# Patient Record
Sex: Female | Born: 1935 | Race: White | Hispanic: No | Marital: Married | Smoking: Never smoker
Health system: Southern US, Community
[De-identification: ages and names within clinical notes are randomized; demographics above are authoritative.]

## PROBLEM LIST (undated history)

## (undated) DIAGNOSIS — M1990 Unspecified osteoarthritis, unspecified site: Secondary | ICD-10-CM

## (undated) DIAGNOSIS — R002 Palpitations: Secondary | ICD-10-CM

## (undated) HISTORY — DX: Unspecified osteoarthritis, unspecified site: M19.90

## (undated) HISTORY — DX: Palpitations: R00.2

---

## 1999-04-14 ENCOUNTER — Other Ambulatory Visit: Admission: RE | Admit: 1999-04-14 | Discharge: 1999-04-14 | Payer: Self-pay | Admitting: Cardiology

## 1999-12-24 ENCOUNTER — Other Ambulatory Visit: Admission: RE | Admit: 1999-12-24 | Discharge: 1999-12-24 | Payer: Self-pay | Admitting: Cardiology

## 2000-04-22 ENCOUNTER — Encounter (INDEPENDENT_AMBULATORY_CARE_PROVIDER_SITE_OTHER): Payer: Self-pay | Admitting: Specialist

## 2000-04-22 ENCOUNTER — Other Ambulatory Visit: Admission: RE | Admit: 2000-04-22 | Discharge: 2000-04-22 | Payer: Self-pay | Admitting: Obstetrics and Gynecology

## 2001-08-08 ENCOUNTER — Other Ambulatory Visit: Admission: RE | Admit: 2001-08-08 | Discharge: 2001-08-08 | Payer: Self-pay | Admitting: Obstetrics and Gynecology

## 2001-12-05 ENCOUNTER — Encounter: Payer: Self-pay | Admitting: Family Medicine

## 2001-12-05 ENCOUNTER — Encounter (INDEPENDENT_AMBULATORY_CARE_PROVIDER_SITE_OTHER): Payer: Self-pay | Admitting: *Deleted

## 2001-12-05 ENCOUNTER — Ambulatory Visit (HOSPITAL_COMMUNITY): Admission: RE | Admit: 2001-12-05 | Discharge: 2001-12-05 | Payer: Self-pay | Admitting: Family Medicine

## 2002-09-12 ENCOUNTER — Other Ambulatory Visit: Admission: RE | Admit: 2002-09-12 | Discharge: 2002-09-12 | Payer: Self-pay | Admitting: Obstetrics and Gynecology

## 2003-08-08 ENCOUNTER — Ambulatory Visit (HOSPITAL_COMMUNITY): Admission: RE | Admit: 2003-08-08 | Discharge: 2003-08-08 | Payer: Self-pay | Admitting: Family Medicine

## 2003-10-17 ENCOUNTER — Other Ambulatory Visit: Admission: RE | Admit: 2003-10-17 | Discharge: 2003-10-17 | Payer: Self-pay | Admitting: Obstetrics and Gynecology

## 2003-12-07 ENCOUNTER — Ambulatory Visit (HOSPITAL_COMMUNITY): Admission: RE | Admit: 2003-12-07 | Discharge: 2003-12-07 | Payer: Self-pay | Admitting: Orthopaedic Surgery

## 2004-10-16 ENCOUNTER — Other Ambulatory Visit: Admission: RE | Admit: 2004-10-16 | Discharge: 2004-10-16 | Payer: Self-pay | Admitting: Obstetrics and Gynecology

## 2004-12-17 ENCOUNTER — Encounter: Payer: Self-pay | Admitting: Internal Medicine

## 2004-12-23 ENCOUNTER — Encounter (INDEPENDENT_AMBULATORY_CARE_PROVIDER_SITE_OTHER): Payer: Self-pay | Admitting: *Deleted

## 2004-12-23 ENCOUNTER — Ambulatory Visit (HOSPITAL_COMMUNITY): Admission: RE | Admit: 2004-12-23 | Discharge: 2004-12-23 | Payer: Self-pay | Admitting: Gastroenterology

## 2004-12-24 ENCOUNTER — Encounter: Admission: RE | Admit: 2004-12-24 | Discharge: 2004-12-24 | Payer: Self-pay | Admitting: Neurology

## 2005-08-03 HISTORY — PX: OTHER SURGICAL HISTORY: SHX169

## 2006-01-14 ENCOUNTER — Encounter: Admission: RE | Admit: 2006-01-14 | Discharge: 2006-01-14 | Payer: Self-pay | Admitting: Orthopaedic Surgery

## 2006-03-23 ENCOUNTER — Encounter: Admission: RE | Admit: 2006-03-23 | Discharge: 2006-03-23 | Payer: Self-pay | Admitting: Sports Medicine

## 2008-04-25 ENCOUNTER — Encounter: Admission: RE | Admit: 2008-04-25 | Discharge: 2008-04-25 | Payer: Self-pay | Admitting: Internal Medicine

## 2008-12-11 ENCOUNTER — Encounter: Payer: Self-pay | Admitting: Internal Medicine

## 2009-01-14 ENCOUNTER — Ambulatory Visit: Payer: Self-pay | Admitting: Internal Medicine

## 2009-01-14 DIAGNOSIS — R05 Cough: Secondary | ICD-10-CM

## 2009-01-14 DIAGNOSIS — G2581 Restless legs syndrome: Secondary | ICD-10-CM | POA: Insufficient documentation

## 2009-01-14 DIAGNOSIS — R059 Cough, unspecified: Secondary | ICD-10-CM | POA: Insufficient documentation

## 2009-01-28 ENCOUNTER — Ambulatory Visit: Payer: Self-pay | Admitting: Internal Medicine

## 2010-01-29 ENCOUNTER — Encounter (INDEPENDENT_AMBULATORY_CARE_PROVIDER_SITE_OTHER): Payer: Self-pay | Admitting: *Deleted

## 2010-04-14 ENCOUNTER — Encounter (INDEPENDENT_AMBULATORY_CARE_PROVIDER_SITE_OTHER): Payer: Self-pay | Admitting: *Deleted

## 2010-04-16 DIAGNOSIS — K5730 Diverticulosis of large intestine without perforation or abscess without bleeding: Secondary | ICD-10-CM | POA: Insufficient documentation

## 2010-04-16 DIAGNOSIS — K219 Gastro-esophageal reflux disease without esophagitis: Secondary | ICD-10-CM | POA: Insufficient documentation

## 2010-04-21 ENCOUNTER — Ambulatory Visit: Payer: Self-pay | Admitting: Internal Medicine

## 2010-04-22 ENCOUNTER — Ambulatory Visit: Payer: Self-pay | Admitting: Internal Medicine

## 2010-04-24 ENCOUNTER — Encounter: Payer: Self-pay | Admitting: Internal Medicine

## 2010-05-02 ENCOUNTER — Telehealth: Payer: Self-pay | Admitting: Internal Medicine

## 2010-05-29 ENCOUNTER — Encounter: Admission: RE | Admit: 2010-05-29 | Discharge: 2010-05-29 | Payer: Self-pay | Admitting: Family Medicine

## 2010-06-06 ENCOUNTER — Encounter: Admission: RE | Admit: 2010-06-06 | Discharge: 2010-06-06 | Payer: Self-pay | Admitting: Family Medicine

## 2010-08-24 ENCOUNTER — Encounter: Payer: Self-pay | Admitting: Sports Medicine

## 2010-09-02 NOTE — Letter (Signed)
Summary: [facility]  [facility]   Imported By: Sherian Rein 05/14/2010 08:35:07  _____________________________________________________________________  External Attachment:    Type:   Image     Comment:   External Document

## 2010-09-02 NOTE — Progress Notes (Signed)
Summary: Cx Manometry/PH Probe  Phone Note Other Incoming   Caller: [facility] Summary of Call: Patient called to cancel appointment for 24 hour PH probe as well as manometry on Monday since she feels better now. Hospital called to let us know this. Initial call taken by: Lamona Curl CMA Duncan Dull),  May 02, 2010 5:19 PM

## 2010-09-02 NOTE — Letter (Signed)
Summary: Patient [facility] Biopsy Results  [facility]  [address] [place], [place] [address]   Phone: [phone]  Fax: [phone]        April 24, 2010 MRN: [id number]    [facility] [address] [place], [place]  [address]    Dear Ms. Weigelt,  I am pleased to inform you that the biopsies taken during your recent endoscopic examination did not show any evidence of cancer upon pathologic examination.The biopsies from Your esophagus showed mild inflammation due to reflux  Additional information/recommendations:  __No further action is needed at this time.  Please follow-up with      your primary care physician for your other healthcare needs.  __ Please call [phone] to schedule a return visit to review      your condition.  _x_ Continue with the treatment plan as outlined on the day of your      exam.     Please call us if you are having persistent problems or have questions about your condition that have not been fully answered at this time.  Sincerely,  Hart Carwin MD  This letter has been electronically signed by your physician.  Appended Document: Patient Notice-Endo Biopsy Results letter mailed

## 2010-09-02 NOTE — Assessment & Plan Note (Signed)
Summary: GERD/CHRONIC PPI/YF   History of Present Illness Visit Type: consult  Primary GI MD: Lina Sar MD Primary Provider: Larina Earthly, MD  Requesting Provider: Larina Earthly, MD  Chief Complaint: GERD and bloating  History of Present Illness:   This is a 77 year old white female with a several year history of cough which occurs during the day but mostly at night whenever she lays down. The cough is dry and she has coughing spells lasting several minutes at a time. It occurs within 5 minutes of laying down and keeps her up at night.It is relieved by sitting up. There has been an occasional problem swallowing liquids as well as solids. A trial of acid reducing agent has not helped significantly. She has seen Dr.Wert recently who says that her cough is multifactorial; one of factors  being gastroesophageal reflux. She denies having any clinically overt reflux in the past. An upper abdominal ultrasound in May 2003 was negative. An upper GI series in May 2003 showed numerous tertiary contractions of her esophagus consistent with esophageal dysmotility. A colonoscopy in May 2006 showed diverticulosis. She has tried cough syrup to suppress the cough which has been the only medication that has helped. She has never been tried on steroids and she has never seen an ear, nose and throat specialist. There has been intermittent hoarseness and raspiness of her voice. Patient has gained about 35 pounds in the last several years.   GI Review of Systems    Reports acid reflux, bloating, and  heartburn.      Denies abdominal pain, belching, chest pain, dysphagia with liquids, dysphagia with solids, loss of appetite, nausea, vomiting, vomiting blood, weight loss, and  weight gain.        Denies anal fissure, black tarry stools, change in bowel habit, constipation, diarrhea, diverticulosis, fecal incontinence, heme positive stool, hemorrhoids, irritable bowel syndrome, jaundice, light color stool, liver problems,  rectal bleeding, and  rectal pain.    Current Medications (verified): 1)  Calcium Carbonate-Vitamin D 600-400 Mg-Unit  Tabs (Calcium Carbonate-Vitamin D) .... Take 1 Tablet By Mouth Two Times A Day 2)  Glucosamine-Chondroitin   Caps (Glucosamine-Chondroit-Vit C-Mn) .... Take 1 Capsule By Mouth Once A Day 3)  Ativan 2 Mg Tabs (Lorazepam) .Marland Kitchen.. 1 At Bedtime 4)  Requip 4 Mg Tabs (Ropinirole Hcl) .... Take 1 Tablet By Mouth Two Times A Day 5)  Prilosec 20 Mg Cpdr (Omeprazole) .... Take 1 Capsule By Mouth Once A Day 30 Minutes Before Meal 6)  Chlor-Trimeton 4 Mg Tabs (Chlorpheniramine Maleate) .... Take 1 Tab By Mouth At Bedtime 7)  Delsym 30 Mg/19ml Lqcr (Dextromethorphan Polistirex) .... 2 Teaspoons Every 12 Hours As Needed  Allergies (verified): 1)  ! Sulfa  Past History:  Past Medical History: RESTLESS LEG SYNDROME (ICD-333.94) CHRONIC COUGH     - onset 1994 DIVERTICULOSIS, COLON (ICD-562.10) GERD (ICD-530.81) COUGH (ICD-786.2) RESTLESS LEG SYNDROME (ICD-333.94)   Arthritis  Past Surgical History: Reviewed history from 04/16/2010 and no changes required. 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  Alcohol Use - no Daily Caffeine Use: 2 daily  Illicit Drug Use - no Smoking Status:  never Drug Use:  no  Review of Systems       The patient complains of anxiety-new, arthritis/joint pain, back pain, cough, fatigue, shortness of breath, and sleeping problems.  The patient denies allergy/sinus, anemia, blood in urine, breast changes/lumps, change in vision,  confusion, coughing up blood, depression-new, fainting, fever, headaches-new, hearing problems, heart murmur, heart rhythm changes, itching, menstrual pain, muscle pains/cramps, night sweats, nosebleeds, pregnancy symptoms, skin rash, sore throat, swelling of feet/legs, swollen lymph glands, thirst - excessive, urination -  excessive, urination changes/pain, urine leakage, vision changes, and voice change.         Pertinent positive and negative review of systems were noted in the above HPI. All other ROS was otherwise negative.   Vital Signs:  Patient profile:   77 year old female Height:      64 inches Weight:      166 pounds BMI:     28.60 BSA:     1.81 Pulse rate:   92 / minute Pulse rhythm:   regular BP sitting:   126 / 80  (left arm) Cuff size:   regular  Vitals Entered By: Ok Anis CMA (April 21, 2010 10:47 AM)  Physical Exam  General:  alert, oriented, slightly raspy voice. Eyes:  PERRLA, no icterus. Mouth:  No deformity or lesions, dentition normal. Neck:  Supple; no masses or thyromegaly. Lungs:  Clear throughout to auscultation. Heart:  Regular rate and rhythm; no murmurs, rubs,  or bruits. Abdomen:  soft nontender abdomen with normoactive bowel sounds. No distention. Liver edge at costal margin. Extremities:  No clubbing, cyanosis, edema or deformities noted. Skin:  Intact without significant lesions or rashes. Psych:  Alert and cooperative. Normal mood and affect.   Impression & Recommendations:  Problem # 1:  COUGH (ICD-786.2) Patient has chronic nocturnal cough which is positional , precipitated by laying down. and relieved by sitting up.  She gets no relief from acid suppressing agents. We will proceed with a 24 hour intraesophageal pH probe off acid reducers and also with esophageal manometry to assess her  motility which was abnormal 7 years ago. He will also schedule her for an upper endoscopy and possible dilatation with appropriate biopsies from the GE junction.  Orders: Mano/24 hour pH Probe (Mano/24 pH)  Problem # 2:  DIVERTICULOSIS, COLON (ICD-562.10) Patient is status post a normal colonoscopy in 2006. A recall colonscopy will be due 10 years from that date.  Problem # 3:  GERD (ICD-530.81) There is no history of gastroesophageal reflux as told by the patient.  We need to rule out silent reflux or LPR. I would consider a referral to [facility] to see Dr.Rees or Dr Delford Field. (phone [phone]) based on the results of GI evaluation.  Orders: Mano/24 hour pH Probe (Mano/24 pH) EGD (EGD)  Patient Instructions: 1)  upper endoscopy with biopsies and possible dilation. 2)  24 hour intraesophageal pH probe off Prilosec. 3)  Esophageal manometry. 4)  Based on the results of the above workup, consider referral to [facility] (Dr Madilyn Hook). 5)  Copy sent to : Dr Felipa Eth 6)  The medication list was reviewed and reconciled.  All changed / newly prescribed medications were explained.  A complete medication list was provided to the patient / caregiver.  Appended Document: GERD/CHRONIC PPI/YF Other Incoming   Caller: [facility] Summary of Call: Patient called to cancel appointment for 24 hour PH probe as well as manometry on Monday since she feels better now. Hospital called to let us know this. Initial call taken by: Lamona Curl CMA Duncan Dull),  May 02, 2010 5:19 PM       Signed by Lamona Curl CMA (AAMA) on 05/02/2010 at 5:20 PM

## 2010-09-02 NOTE — Procedures (Signed)
Summary: Upper Endoscopy  Patient: Alexandra Higgins Note: All result statuses are Final unless otherwise noted.  Tests: (1) Upper Endoscopy (EGD)   EGD Upper Endoscopy       DONE     [facility]     [address]     [place], [place]  [address]           ENDOSCOPY PROCEDURE REPORT           PATIENT:  [name], [name]  MR#:  [id number]     BIRTHDATE:  08/30/1928, 81 yrs. old  GENDER:  female           ENDOSCOPIST:  Hedwig Morton. Juanda Chance, MD     Referred by:  Chilton Greathouse, M.D.           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  The [facility] GIF-H180 [id number] endoscope was     introduced through the mouth and advanced to the second portion of     the duodenum, without limitations.  The instrument was slowly     withdrawn as the mucosa was fully examined.     <<PROCEDUREIMAGES>>           The upper, middle, and distal third of the esophagus were     carefully inspected and no abnormalities were noted. The z-line     was well seen at the GEJ. The endoscope was pushed into the fundus     which was normal including a retroflexed view. The antrum,gastric     body, first and second part of the duodenum were unremarkable.     slightly tortuous esophagus, ? presbyesophagud, no stricture,     normal z-line With standard forceps, a biopsy was obtained and     sent to pathology (see image1, image2, image3, image5, image6,     image7, image8, and image9). g-e junction to r/o reflux changes     Retroflexed views revealed no abnormalities.    The scope was then     withdrawn  from the patient and the procedure completed.           COMPLICATIONS:  None           ENDOSCOPIC IMPRESSION:     1) Normal EGD     s/p biopsies of g-e junction,     suspect ;presbyesophagus     RECOMMENDATIONS:     1) Await biopsy results     pending 24 hr intraesophageal pH probe OFF Prelosec,     esophageal manometry scheduled           REPEAT EXAM:  In 0 year(s) for.           ______________________________     Hedwig Morton. Juanda Chance, MD           CC:           n.  eSIGNED:   Hedwig Morton. Brodie at 04/22/2010 05:07 PM           Alexandra Higgins, [id number]  Note: An exclamation mark (!) indicates a result that was not dispersed into the flowsheet. Document Creation Date: 04/22/2010 5:08 PM _______________________________________________________________________  (1) Order result status: Final Collection or observation date-time: 04/22/2010 16:56 Requested date-time:  Receipt date-time:  Reported date-time:  Referring Physician:   Ordering Physician: Lina Sar [phone]) Specimen Source:  Source: Launa Grill Order Number: [id number] Lab site:

## 2010-09-02 NOTE — Letter (Signed)
Summary: [facility] Office Visit  [facility] Office Visit   Imported By: Lamona Curl CMA (AAMA) 04/16/2010 12:50:15  _____________________________________________________________________  External Attachment:    Type:   Image     Comment:   External Document

## 2010-09-02 NOTE — Letter (Signed)
Summary: [facility]  [facility]   Imported By: Lamona Curl CMA (AAMA) 04/16/2010 12:52:57  _____________________________________________________________________  External Attachment:    Type:   Image     Comment:   External Document

## 2010-09-02 NOTE — Letter (Signed)
Summary: EGD Instructions  [facility]  [address] [place], [place] [address]   Phone: [phone]  Fax: [phone]       Alexandra Higgins    08/30/1928    MRN: [id number]       Procedure Day /Date: 04/22/10 Tuesday     Arrival Time: 3:00 pm     Procedure Time: 4:00 pm     Location of Procedure:                    _ x _ [facility] (4th Floor)  PREPARATION FOR ENDOSCOPY   On 04/22/10 THE DAY OF THE PROCEDURE:  1.   No solid foods, milk or milk products are allowed after midnight the night before your procedure.  2.   Do not drink anything colored red or purple.  Avoid juices with pulp.  No orange juice.  3.  You may drink clear liquids until 2:00 pm, which is 2 hours before your procedure.                                                                                                CLEAR LIQUIDS INCLUDE: Water Jello Ice Popsicles Tea (sugar ok, no milk/cream) Powdered fruit flavored drinks Coffee (sugar ok, no milk/cream) Gatorade Juice: apple, white grape, white cranberry  Lemonade Clear bullion, consomm, broth Carbonated beverages (any kind) Strained chicken noodle soup Hard Candy   MEDICATION INSTRUCTIONS  Unless otherwise instructed, you should take regular prescription medications with a small sip of water as early as possible the morning of your procedure.  ]                  OTHER INSTRUCTIONS  You will need a responsible adult at least 75 years of age to accompany you and drive you home.   This person must remain in the waiting room during your procedure.  Wear loose fitting clothing that is easily removed.  Leave jewelry and other valuables at home.  However, you may wish to bring a book to read or an iPod/MP3 player to listen to music as you wait for your procedure to start.  Remove all body piercing jewelry and leave at home.  Total time from sign-in until discharge is approximately 2-3 hours.  You should go  home directly after your procedure and rest.  You can resume normal activities the day after your procedure.  The day of your procedure you should not:   Drive   Make legal decisions   Operate machinery   Drink alcohol   Return to work  You will receive specific instructions about eating, activities and medications before you leave.    The above instructions have been reviewed and explained to me by   Lamona Curl CMA Duncan Dull)  April 21, 2010 12:01 PM     I fully understand and can verbalize these instructions _____________________________ Date 04/21/10

## 2010-10-18 ENCOUNTER — Inpatient Hospital Stay (HOSPITAL_COMMUNITY): Payer: Medicare Other

## 2010-10-18 ENCOUNTER — Inpatient Hospital Stay (HOSPITAL_COMMUNITY)
Admission: AD | Admit: 2010-10-18 | Discharge: 2010-10-21 | DRG: 493 | Disposition: A | Discharge status: Home Health | Payer: Medicare Other | Source: Ambulatory Visit | Attending: Orthopaedic Surgery | Admitting: Orthopaedic Surgery

## 2010-10-18 DIAGNOSIS — W010XXA Fall on same level from slipping, tripping and stumbling without subsequent striking against object, initial encounter: Secondary | ICD-10-CM | POA: Diagnosis present

## 2010-10-18 DIAGNOSIS — S82009A Unspecified fracture of unspecified patella, initial encounter for closed fracture: Principal | ICD-10-CM | POA: Diagnosis present

## 2010-10-18 DIAGNOSIS — Z23 Encounter for immunization: Secondary | ICD-10-CM

## 2010-10-18 DIAGNOSIS — N390 Urinary tract infection, site not specified: Secondary | ICD-10-CM | POA: Diagnosis not present

## 2010-10-18 LAB — COMPREHENSIVE METABOLIC PANEL
ALT: 17 U/L (ref 0–35)
Albumin: 3.9 g/dL (ref 3.5–5.2)
Alkaline Phosphatase: 64 U/L (ref 39–117)
BUN: 13 mg/dL (ref 6–23)
Calcium: 9 mg/dL (ref 8.4–10.5)
Chloride: 105 mEq/L (ref 96–112)
Creatinine, Ser: 0.75 mg/dL (ref 0.4–1.2)
GFR calc Af Amer: >60 mL/min (ref >60)
GFR calc non Af Amer: >60 mL/min (ref >60)
Glucose, Bld: 110 mg/dL — ABNORMAL HIGH (ref 70–99)
Potassium: 3.3 mEq/L — ABNORMAL LOW (ref 3.5–5.1)
Total Bilirubin: 0.6 mg/dL (ref 0.3–1.2)
Total Protein: 7.6 g/dL (ref 6.0–8.3)

## 2010-10-18 LAB — CBC: RBC: 4.45 MIL/uL (ref 3.87–5.11)

## 2010-10-18 LAB — PROTIME-INR: INR: 0.94 (ref 0.00–1.49)

## 2010-10-18 LAB — URINALYSIS, ROUTINE W REFLEX MICROSCOPIC
Glucose, UA: NEGATIVE mg/dL
Nitrite: NEGATIVE
Protein, ur: NEGATIVE mg/dL

## 2010-10-18 LAB — MRSA PCR SCREENING: MRSA by PCR: NEGATIVE

## 2010-10-21 LAB — URINE CULTURE: Culture  Setup Time: 201203190540

## 2010-12-01 NOTE — Discharge Summary (Signed)
  NAME:  Alexandra Higgins, Alexandra Higgins                   ACCOUNT NO.:  [id number]  MEDICAL RECORD NO.:  [id number]           Higgins TYPE:  I  LOCATION:  5037                         FACILITY:  [facility]  PHYSICIAN:  Perseus Westall C. Ophelia Charter, M.D.    DATE OF BIRTH:  September 14, 1930  DATE OF ADMISSION:  10/18/2010 DATE OF DISCHARGE:  10/21/2010                              DISCHARGE SUMMARY   ADMISSION DIAGNOSIS:  Right transverse patella fracture.  DISCHARGE DIAGNOSIS:  Right transverse patella fracture.  PROCEDURE:  Open reduction and internal fixation right patella fracture performed by Dr. Ophelia Charter under general anesthesia with femoral nerve block and local anesthetic.  CONSULTATIONS:  None.  BRIEF HISTORY:  Alexandra Higgins is a 77 year old female who fell injuring her right knee.  Radiographs demonstrated a right transverse patella fracture with displacement.  It was felt that she would benefit from surgical intervention.  She was admitted for Alexandra procedure as stated above.  BRIEF HOSPITAL COURSE:  Alexandra Higgins tolerated Alexandra procedure under general anesthesia without complications.  She was placed in a knee immobilizer which was not removed during Alexandra hospital stay.  Physical therapy was initiated for ambulatory purposes.  Alexandra Higgins was allowed no flexion of Alexandra knee.  Eventually, she was able to ambulate 50 feet weightbearing as tolerated using a walker.  Dressing remained dry and clean throughout Alexandra hospital stay.  Alexandra Higgins was able to void without difficulty.  She was taking a regular diet at Alexandra time of discharge.  Pertinent laboratory values on admission CBC with WBC 12.5, hemoglobin 13.6, hematocrit 39.8.  Coagulation studies within normal limits.  Chemistry studies on admission with potassium 3.3, glucose 110, remaining values within normal limits.  Urine culture from admission urine specimen showed no growth.  Alexandra Higgins was on Cipro orally throughout Alexandra hospital stay for her initial urinalysis did show  signs of urinary tract infection.  Alexandra Higgins did have some nausea which resolved prior to discharge.  On October 21, 2010, she was discharged to her home in stable condition.  PLAN:  Alexandra Higgins will keep her dressing dry and clean.  She will wear her knee immobilizer full time.  She is allowed weightbearing as tolerated.  Genevieve Norlander will provide home health occupational therapy and physical therapy.  Alexandra Higgins will follow up with Dr. Ophelia Charter in 1 week. She will continue on home medications as taken prior to admission. Medication reconciliation form was provided for Alexandra Higgins with these instructions.  She was given a prescription for Vicodin 1-2 every 4-6 hours as needed for pain.  Alexandra Higgins advised to call Alexandra office should she have questions or concerns prior to return office visit.  All questions encouraged and answered.  CONDITION ON DISCHARGE:  Stable.     Wende Neighbors, P.A.   ______________________________ Veverly Fells Ophelia Charter, M.D.    SMV/MEDQ  D:  11/20/2010  T:  11/20/2010  Job:  [id number]  Electronically Signed by Maud Deed P.A. on 11/21/2010 12:45:57 PM Electronically Signed by Annell Greening M.D. on 12/01/2010 04:11:29 PM

## 2010-12-01 NOTE — Op Note (Signed)
  NAME:  Alexandra Higgins, Alexandra Higgins                   ACCOUNT NO.:  [id number]  MEDICAL RECORD NO.:  [id number]          PATIENT TYPE:  LOCATION:                                 FACILITY:  PHYSICIAN:  Birdia Jaycox C. Ophelia Charter, M.D.    DATE OF BIRTH:  02-06-1931  DATE OF PROCEDURE: DATE OF DISCHARGE:                              OPERATIVE REPORT   PREOPERATIVE DIAGNOSIS:  Right displaced patellar fracture.  POSTOPERATIVE DIAGNOSIS:  Right displaced patellar fracture.  PROCEDURE:  Open reduction and internal fixation of right patellar fracture.  SURGEON:  Esty Ahuja C. Ophelia Charter, MD  ANESTHESIA:  GOT plus regional plus Marcaine local.  TOURNIQUET TIME:  Less than an hour.  This is a 77 year old female fell coming out of her house, stripped, falling, landing on her knee with displaced transverse patellar fracture and inability to a straight-leg raise.  X-rays demonstrated displaced transverse patellar fracture.  PROCEDURE:  After induction of general anesthesia, orotracheal intubation, standard prepping and draping, proximal thigh tourniquet, DuraPrep, impervious stockinette, Coban, and extremity sheets and drapes, time-out procedure was completed, Betadine Steri-Drape was applied.  Midline incision was made.  The bursa was divided, hematoma was evacuated from the knee.  Soft tissues were cleaned off the edges of the bone.  There was slight comminution despite this was primarily transverse patellar fracture at the midportion of the patella.  Using towel clips, grasping the two ends, fracture was reduced, held and fixed with two K-wires for DePuy cannulated screw fixation.  The cancellous lag screws were inserted from distal to proximal, checked under fluoroscopy, and then FiberWire was used for tension band passing from distal to proximal in a figure-of-eight fashion and then tying it down securely with the reduction obtained.  Screws were slightly long but were not palpable within the tendon.  There was some  tendinopathy in the tendon, it was firm.  He was able to be flexed to 110 degrees and reduction was maintained.  Wound was carefully irrigated in standard layered closure after closure of the medial and lateral retinacula with interrupted Ethibond nonabsorbable sutures followed by 2-0 Vicryl in the superficial retinaculum and prepatellar bursa, 2-0 in the subcutaneous tissue, and then subcuticular skin closure, postop dressing, sponge with the knee in extension and reapplication of the knee immobilizer. Tourniquet was deflated prior to closure.  Hemostasis was obtained. Preoperative antibiotics were given.  Time-out procedure was completed. At the end of the case, the patient tolerated the procedure well and was transferred to recovery room in stable condition.     Halea Lieb C. Ophelia Charter, M.D.     MCY/MEDQ  D:  11/20/2010  T:  11/21/2010  Job:  [id number]  Electronically Signed by Annell Greening M.D. on 12/01/2010 04:11:25 PM

## 2010-12-19 NOTE — Op Note (Signed)
NAME:  Alexandra Higgins, Alexandra Higgins                   ACCOUNT NO.:  [id number]   MEDICAL RECORD NO.:  [id number]          PATIENT TYPE:  AMB   LOCATION:  ENDO                         FACILITY:  [facility]   PHYSICIAN:  John C. Madilyn Fireman, M.D.    DATE OF BIRTH:  13-Apr-1931   DATE OF PROCEDURE:  12/23/2004  DATE OF DISCHARGE:                                 OPERATIVE REPORT   PROCEDURE:  Colonoscopy.   INDICATION FOR PROCEDURE:  Left-sided abdominal pain and average risk colon  cancer screening.   DESCRIPTION OF PROCEDURE:  The patient was placed in the left lateral  decubitus position and placed on the pulse monitor with continuous low-flow  oxygen delivered by nasal cannula. She was sedated with 100 mcg of IV  fentanyl and 10 mg of IV Versed. The Olympus video colonoscope was inserted  into the rectum and advanced to the cecum, confirmed by transillumination of  McBurney's point and visualization of the ileocecal valve and appendiceal  orifice. The prep was excellent. The cecum, ascending, transverse and  descending colon all appeared normal with no masses, polyps, diverticula or  other mucosal abnormalities. In the sigmoid colon area there were seen a few  scattered diverticula. No other abnormalities. The rectum appeared normal.  Retroflexed view of the anus revealed no obvious internal hemorrhoids. The  scope was then withdrawn and the patient returned to the recovery room in  stable condition. She tolerated the procedure well and there were no  immediate complications.   IMPRESSION:  Left-sided diverticulosis, otherwise normal study.   PLAN:  Will consider trial of an antispasmodic for her abdominal pain,  although unclear whether related to the GI tract.      JCH/MEDQ  D:  12/23/2004  T:  12/23/2004  Job:  [id number]   cc:   Juluis Mire, M.D.  [address] [place]  [place] [address]  Fax: [phone]

## 2014-03-09 ENCOUNTER — Encounter: Payer: Self-pay | Admitting: Internal Medicine

## 2014-04-06 ENCOUNTER — Other Ambulatory Visit: Payer: Self-pay | Admitting: Sports Medicine

## 2014-04-06 DIAGNOSIS — M5441 Lumbago with sciatica, right side: Secondary | ICD-10-CM

## 2014-04-15 ENCOUNTER — Other Ambulatory Visit: Payer: Medicare Other

## 2014-04-18 ENCOUNTER — Ambulatory Visit
Admission: RE | Admit: 2014-04-18 | Discharge: 2014-04-18 | Disposition: A | Discharge status: Home / Self Care | Payer: Medicare Other | Source: Ambulatory Visit | Attending: Sports Medicine | Admitting: Sports Medicine

## 2014-04-18 DIAGNOSIS — M5441 Lumbago with sciatica, right side: Secondary | ICD-10-CM

## 2014-09-03 ENCOUNTER — Other Ambulatory Visit: Payer: Self-pay | Admitting: Dermatology

## 2015-06-05 ENCOUNTER — Ambulatory Visit (HOSPITAL_COMMUNITY)
Admission: RE | Admit: 2015-06-05 | Discharge: 2015-06-05 | Disposition: A | Discharge status: Home / Self Care | Payer: Medicare Other | Source: Ambulatory Visit | Attending: Sports Medicine | Admitting: Sports Medicine

## 2015-06-05 ENCOUNTER — Encounter (HOSPITAL_COMMUNITY): Payer: Self-pay

## 2015-06-05 ENCOUNTER — Other Ambulatory Visit (HOSPITAL_COMMUNITY): Payer: Self-pay | Admitting: Sports Medicine

## 2015-06-05 DIAGNOSIS — M4856XA Collapsed vertebra, not elsewhere classified, lumbar region, initial encounter for fracture: Secondary | ICD-10-CM | POA: Diagnosis not present

## 2015-06-05 DIAGNOSIS — R079 Chest pain, unspecified: Secondary | ICD-10-CM | POA: Diagnosis not present

## 2015-06-05 DIAGNOSIS — I517 Cardiomegaly: Secondary | ICD-10-CM | POA: Insufficient documentation

## 2015-06-05 DIAGNOSIS — I2699 Other pulmonary embolism without acute cor pulmonale: Secondary | ICD-10-CM | POA: Insufficient documentation

## 2015-06-05 LAB — POCT I-STAT CREATININE: CREATININE: 0.8 mg/dL (ref 0.44–1.00)

## 2015-06-05 MED ORDER — IOHEXOL 300 MG/ML  SOLN
75.0000 mL | Freq: Once | INTRAMUSCULAR | Status: AC | PRN
  Administered 2015-06-05: 75 mL via INTRAVENOUS

## 2015-06-07 ENCOUNTER — Other Ambulatory Visit: Payer: Self-pay | Admitting: Internal Medicine

## 2015-06-07 DIAGNOSIS — M79605 Pain in left leg: Secondary | ICD-10-CM

## 2015-06-07 DIAGNOSIS — M79604 Pain in right leg: Secondary | ICD-10-CM

## 2015-06-07 DIAGNOSIS — I2699 Other pulmonary embolism without acute cor pulmonale: Secondary | ICD-10-CM

## 2015-06-10 ENCOUNTER — Ambulatory Visit
Admission: RE | Admit: 2015-06-10 | Discharge: 2015-06-10 | Disposition: A | Discharge status: Home / Self Care | Payer: Medicare Other | Source: Ambulatory Visit | Attending: Internal Medicine | Admitting: Internal Medicine

## 2015-06-10 DIAGNOSIS — M79604 Pain in right leg: Secondary | ICD-10-CM

## 2015-06-10 DIAGNOSIS — M79605 Pain in left leg: Secondary | ICD-10-CM

## 2015-06-10 DIAGNOSIS — I2699 Other pulmonary embolism without acute cor pulmonale: Secondary | ICD-10-CM

## 2015-06-19 ENCOUNTER — Other Ambulatory Visit: Payer: Self-pay

## 2015-06-19 ENCOUNTER — Telehealth (HOSPITAL_COMMUNITY): Payer: Self-pay | Admitting: *Deleted

## 2015-06-19 DIAGNOSIS — Z1231 Encounter for screening mammogram for malignant neoplasm of breast: Secondary | ICD-10-CM

## 2015-06-21 ENCOUNTER — Other Ambulatory Visit (HOSPITAL_COMMUNITY): Payer: Self-pay | Admitting: Internal Medicine

## 2015-06-21 DIAGNOSIS — I517 Cardiomegaly: Secondary | ICD-10-CM

## 2015-07-01 ENCOUNTER — Telehealth: Payer: Self-pay

## 2015-07-01 NOTE — Telephone Encounter (Signed)
LEFT MESSGAE WITH Alexandra Higgins (VOICEMAIL) THAT PATIENT NEEDS A MEDICAL PRECERT FOR HER UPCOMING ECHO CPT CODE 60454 FACILITY [facility] [place] /[facility]-PRECERT BILLING

## 2015-07-01 NOTE — Telephone Encounter (Signed)
CALLED DR [facility] OFFICE TWICE TO REMIND THEM THAT A MEDICAL PRECERT HAS NOT BEEN OBTAINED  WITH [employer]  .THE INSURANCE WILL NOT PAY  ECHO I LEFT TWO MESSAGES ON DJTHE NURSE TO CALL ME BACK BEFORE 5 TODAY

## 2015-07-02 ENCOUNTER — Other Ambulatory Visit: Payer: Self-pay

## 2015-07-02 ENCOUNTER — Ambulatory Visit (HOSPITAL_COMMUNITY): Payer: Medicare Other | Attending: Cardiovascular Disease

## 2015-07-02 DIAGNOSIS — I313 Pericardial effusion (noninflammatory): Secondary | ICD-10-CM | POA: Insufficient documentation

## 2015-07-02 DIAGNOSIS — I517 Cardiomegaly: Secondary | ICD-10-CM | POA: Insufficient documentation

## 2015-07-02 DIAGNOSIS — I071 Rheumatic tricuspid insufficiency: Secondary | ICD-10-CM | POA: Insufficient documentation

## 2015-07-10 ENCOUNTER — Ambulatory Visit
Admission: RE | Admit: 2015-07-10 | Discharge: 2015-07-10 | Disposition: A | Discharge status: Home / Self Care | Payer: Medicare Other | Source: Ambulatory Visit

## 2015-07-10 DIAGNOSIS — Z1231 Encounter for screening mammogram for malignant neoplasm of breast: Secondary | ICD-10-CM

## 2015-07-19 ENCOUNTER — Telehealth: Payer: Self-pay | Admitting: Cardiovascular Disease

## 2015-07-19 NOTE — Telephone Encounter (Signed)
Received records from [facility] for appointment on 07/23/15 with Dr Oval Linsey.  Records given to [facility] (medical records) for Dr Blenda Mounts schedule on 07/23/15. lp

## 2015-07-23 ENCOUNTER — Encounter: Payer: Self-pay | Admitting: Cardiovascular Disease

## 2015-07-23 ENCOUNTER — Encounter (INDEPENDENT_AMBULATORY_CARE_PROVIDER_SITE_OTHER): Payer: Medicare Other

## 2015-07-23 ENCOUNTER — Ambulatory Visit (INDEPENDENT_AMBULATORY_CARE_PROVIDER_SITE_OTHER): Payer: Medicare Other | Admitting: Cardiovascular Disease

## 2015-07-23 VITALS — BP 122/78 | HR 111 | Ht 62.5 in | Wt 179.7 lb

## 2015-07-23 DIAGNOSIS — R002 Palpitations: Secondary | ICD-10-CM

## 2015-07-23 DIAGNOSIS — R072 Precordial pain: Secondary | ICD-10-CM | POA: Diagnosis not present

## 2015-07-23 DIAGNOSIS — R0602 Shortness of breath: Secondary | ICD-10-CM

## 2015-07-23 HISTORY — DX: Palpitations: R00.2

## 2015-07-23 NOTE — Patient Instructions (Signed)
Your physician recommends that you return for lab work in [place] has requested that you have a [place]. For further information please visit [email or web address]. Please follow instruction sheet, as given.  Your physician has recommended that you wear an event monitor FOR 7 DAYS- PALP. Event monitors are medical devices that record the heart's electrical activity. Doctors most often [place] these monitors to diagnose arrhythmias. Arrhythmias are problems with the speed or rhythm of the heartbeat. The monitor is a small, portable device. You can wear one while you do your normal daily activities. This is usually used to diagnose what is causing palpitations/syncope (passing out).   If you need a refill on your cardiac medications before your next appointment, please call your pharmacy.   Your Doctor has ordered you to wear a heart monitor. You will wear this for  7 days.   TIPS -  REMINDERS 1. The sensor is the lanyard that is worn around your neck every day - this is powered by a battery that needs to be changed every day 2. The monitor is the device that allows you to record symptoms - this will need to be charged daily 3. The sensor & monitor need to be within 100 feet of each other at all times 4. The sensor connects to the electrodes (stickers) - these should be changed every 24-48 hours (you do not have to remove them when you bathe, just make sure they are dry when you connect it back to the sensor 5. If you need more supplies (electrodes, batteries), please call the [phone] on the back of the pamphlet and [employer] will mail you more supplies 6. If your skin becomes sensitive, please try the sample pack of sensitive skin electrodes (the white packet in your silver box) and call [employer] to have them mail you more of these type of electrodes 7. When you are finish wearing the monitor, please place all supplies back in the silver box, place the silver  box in the pre-packaged UPS bag and drop off at [employer] or call them so they can come pick it up   Cardiac Event Monitoring A cardiac event monitor is a small recording device used to help detect abnormal heart rhythms (arrhythmias). The monitor is used to record heart rhythm when noticeable symptoms such as the following occur:  Fast heartbeats (palpitations), such as heart racing or fluttering.  Dizziness.  Fainting or light-headedness.  Unexplained weakness. The monitor is wired to two electrodes placed on your chest. Electrodes are flat, sticky disks that attach to your skin. The monitor can be worn for up to 30 days. You will wear the monitor at all times, except when bathing.  HOW TO USE YOUR CARDIAC EVENT MONITOR A technician will prepare your chest for the electrode placement. The technician will show you how to place the electrodes, how to work the monitor, and how to replace the batteries. Take time to practice using the monitor before you leave the office. Make sure you understand how to send the information from the monitor to your health care provider. This requires a telephone with a landline, not a cell phone. You need to:  Wear your monitor at all times, except when you are in water:  Do not get the monitor wet.  Take the monitor off when bathing. Do not swim or use a hot tub with it on.  Keep your skin clean. Do not put body lotion or moisturizer on  your chest.  Change the electrodes daily or any time they stop sticking to your skin. You might need to use tape to keep them on.  It is possible that your skin under the electrodes could become irritated. To keep this from happening, try to put the electrodes in slightly different places on your chest. However, they must remain in the area under your left breast and in the upper right section of your chest.  Make sure the monitor is safely clipped to your clothing or in a location close to your body that your health care  provider recommends.  Press the button to record when you feel symptoms of heart trouble, such as dizziness, weakness, light-headedness, palpitations, thumping, shortness of breath, unexplained weakness, or a fluttering or racing heart. The monitor is always on and records what happened slightly before you pressed the button, so do not worry about being too late to get good information.  Keep a diary of your activities, such as walking, doing chores, and taking medicine. It is especially important to note what you were doing when you pushed the button to record your symptoms. This will help your health care provider determine what might be contributing to your symptoms. The information stored in your monitor will be reviewed by your health care provider alongside your diary entries.  Send the recorded information as recommended by your health care provider. It is important to understand that it will take some time for your health care provider to process the results.  Change the batteries as recommended by your health care provider. SEEK IMMEDIATE MEDICAL CARE IF:   You have chest pain.  You have extreme difficulty breathing or shortness of breath.  You develop a very fast heartbeat that persists.  You develop dizziness that does not go away.  You faint or constantly feel you are about to faint. Document Released: 04/28/2008 Document Revised: 12/04/2013 Document Reviewed: 01/16/2013 [facility] Patient Information 2015 [place], [place]. This information is not intended to replace advice given to you by your health care provider. Make sure you discuss any questions you have with your health care provider.

## 2015-07-23 NOTE — Progress Notes (Signed)
Cardiology Office Note   Date:  07/23/2015   ID:  Alexandra Higgins, DOB [birth year], MRN [id number]  PCP:  Tivis Ringer, MD  Cardiologist:   Sharol Harness, MD   Chief Complaint  Patient presents with  . New Evaluation    pt c/o heart racing and SOB, less energy than normal       History of Present Illness: Alexandra Higgins is a 81 y.o. female with GERD and pulmonary embolism who presents for an evaluation of palpitations.  [name] was evaluated by her PCP, Alexandra Morita, MD, on 07/18/15.  At that appointment she reported a new diagnosis of PE that occurred after a period of immobility due to knee pain.  She was started on Xarelto.  She also reports palpitations and shortness of breath.  She had an echo that reportedly showed normal systolic function, grade 1 diastolic dysfunction, and a small pericardial effusion.  For the last 2 months she has noted shortness of breath.  It is worse when at rest and improves with exertion.  It is also worse when supine.  She has been sleeping on 3 pillows, which is up from her usual 2 pillows.  This is mostly due to back pain.  She denies lower extremity edema or PND.  She also reports a 20 lb weight gain over the last year.  [name] also reports occasional episodes of palpitations.  This occurs sporadically and is not associated with exertion.  For the last month she has noted episodes of heart racing that occur at rest.  It lasts up to 20 minutes at a time.  She denies shortness of breath and presyncope.  She does feel trembly when it happens.  It occurs approximately every 3 days but sometimes it only occurs once in a week.  [name] does not get much exercise due to chronic back pain.  She tries to walk she feels well enough.  She walks around her block most days.  Past Medical History  Diagnosis Date  . Arthritis   . Palpitations 07/23/2015    History reviewed. No pertinent past surgical history.   Current Outpatient Prescriptions    Medication Sig Dispense Refill  . HYDROcodone-acetaminophen (NORCO/VICODIN) 5-325 MG tablet Take 1 tablet by mouth as needed.  0  . LORazepam (ATIVAN) 2 MG tablet Take 2 mg by mouth daily.    [place] Kitchen rOPINIRole (REQUIP) 4 MG tablet Take 4 mg by mouth daily.  2  . traMADol (ULTRAM) 50 MG tablet Take 50 mg by mouth daily.  0  . XARELTO 20 MG TABS tablet Take 20 mg by mouth daily.  1   No current facility-administered medications for this visit.    Allergies:   Sulfonamide derivatives    Social History:  The patient  reports that she has never smoked. She does not have any smokeless tobacco history on file.   Family History:  The patient's family history includes Alzheimer's disease in her mother and sister; Heart failure in her father.    ROS:  Please see the history of present illness.   Otherwise, review of systems are positive for none.   All other systems are reviewed and negative.    PHYSICAL EXAM: VS:  BP 122/78 mmHg  Pulse 111  Ht 5' 2.5" (1.588 m)  Wt 81.511 kg (179 lb 11.2 oz)  BMI 32.32 kg/m2 , BMI Body mass index is 32.32 kg/(m^2). GENERAL:  Well appearing HEENT:  Pupils equal round and reactive,  fundi not visualized, oral mucosa unremarkable NECK:  No jugular venous distention, waveform within normal limits, carotid upstroke brisk and symmetric, no bruits, no thyromegaly LYMPHATICS:  No cervical adenopathy LUNGS:  Clear to auscultation bilaterally HEART:  RRR.  PMI not displaced or sustained,S1 and S2 within normal limits, no S3, no S4, no clicks, no rubs, no murmurs ABD:  Flat, positive bowel sounds normal in frequency in pitch, no bruits, no rebound, no guarding, no midline pulsatile mass, no hepatomegaly, no splenomegaly EXT:  2 plus pulses throughout, no edema, no cyanosis no clubbing SKIN:  No rashes no nodules NEURO:  Cranial nerves II through XII grossly intact, motor grossly intact throughout PSYCH:  Cognitively intact, oriented to person place and time    EKG:   EKG is ordered today. The ekg ordered today demonstrates sinus rhythm with frequent PACs and one PVC.  Echo 07/02/15: Study Conclusions  - Left ventricle: The cavity size was normal. There was mild focal basal hypertrophy of the septum. Systolic function was normal. The estimated ejection fraction was in the range of 55% to 60%. Wall motion was normal; there were no regional wall motion abnormalities. Doppler parameters are consistent with abnormal left ventricular relaxation (grade 1 diastolic dysfunction). - Right ventricle: The cavity size was normal. Wall thickness was normal. Systolic function was normal. - Tricuspid valve: There was mild regurgitation. - Pulmonary arteries: PA peak pressure: 37 mm Hg (S). - Inferior vena cava: The vessel was normal in size. The respirophasic diameter changes were in the normal range (= 50%), consistent with normal central venous pressure. - Pericardium, extracardiac: A small pericardial effusion was identified circumferential to the heart. There was no evidence of hemodynamic compromise.   Recent Labs: 06/05/2015: Creatinine, Ser 0.80    Lipid Panel No results found for: CHOL, TRIG, HDL, CHOLHDL, VLDL, LDLCALC, LDLDIRECT    Wt Readings from Last 3 Encounters:  07/23/15 81.511 kg (179 lb 11.2 oz)  04/21/10 75.297 kg (166 lb)  01/28/09 76.204 kg (168 lb)      ASSESSMENT AND PLAN:  # Shortness of breath: recent echo showed a small pericardial effusion without tamponade.  She has no signs of heart failure on exam.  It is possible that this represents ischemia.  We will obtain a Lexiscan Cardiolite to evaluate for ischemia.It is unlikely that her grade 1 diastolic dysfunction is causing her shortness of breath.This may be due to her pulmonary embolism.  # Palpitations: Likely due to sinus rhythm with PACs and PVCs as seen on her ECG today. We will obtain a 7 day event monitor to ensure that she is not having episodes of  atrial fibrillation. She had normal electrolytes and thyroid function with her primary care physician in November. Therefore we will not repeat them at this time.  # PE: Unprovoked.  Continue Xarelto.  Current medicines are reviewed at length with the patient today.  The patient does not have concerns regarding medicines.  The following changes have been made:  no change  Labs/ tests ordered today include:   Orders Placed This Encounter  Procedures  . Cardiac event monitor  . Myocardial Perfusion Imaging  . EKG 12-Lead     Disposition:   FU with Stayce Delancy C. Oval Linsey, MD, [facility] in 3 months.    Signed, Ira Dougher C. Oval Linsey, MD, [facility]  07/23/2015 3:31 PM    [facility]

## 2015-08-01 ENCOUNTER — Telehealth (HOSPITAL_COMMUNITY): Payer: Self-pay

## 2015-08-01 NOTE — Telephone Encounter (Signed)
Encounter complete. 

## 2015-08-06 ENCOUNTER — Ambulatory Visit (HOSPITAL_COMMUNITY)
Admission: RE | Admit: 2015-08-06 | Discharge: 2015-08-06 | Disposition: A | Discharge status: Home / Self Care | Payer: Medicare Other | Source: Ambulatory Visit | Attending: Cardiovascular Disease | Admitting: Cardiovascular Disease

## 2015-08-06 DIAGNOSIS — R072 Precordial pain: Secondary | ICD-10-CM

## 2015-08-06 DIAGNOSIS — R0609 Other forms of dyspnea: Secondary | ICD-10-CM | POA: Diagnosis not present

## 2015-08-06 DIAGNOSIS — Z6832 Body mass index (BMI) 32.0-32.9, adult: Secondary | ICD-10-CM | POA: Insufficient documentation

## 2015-08-06 DIAGNOSIS — E663 Overweight: Secondary | ICD-10-CM | POA: Diagnosis not present

## 2015-08-06 DIAGNOSIS — R0602 Shortness of breath: Secondary | ICD-10-CM | POA: Diagnosis not present

## 2015-08-06 DIAGNOSIS — Z8249 Family history of ischemic heart disease and other diseases of the circulatory system: Secondary | ICD-10-CM | POA: Diagnosis not present

## 2015-08-06 DIAGNOSIS — R002 Palpitations: Secondary | ICD-10-CM | POA: Diagnosis not present

## 2015-08-06 LAB — MYOCARDIAL PERFUSION IMAGING
CHL CUP NUCLEAR SDS: 3
CHL CUP NUCLEAR SRS: 3
CHL CUP NUCLEAR SSS: 6
CSEPPHR: 121 {beats}/min
Rest HR: 91 {beats}/min
TID: 1.08

## 2015-08-06 MED ORDER — TECHNETIUM TC 99M SESTAMIBI GENERIC - CARDIOLITE
30.2000 | Freq: Once | INTRAVENOUS | Status: AC | PRN
  Administered 2015-08-06: 30.2 via INTRAVENOUS

## 2015-08-06 MED ORDER — REGADENOSON 0.4 MG/5ML IV SOLN
0.4000 mg | Freq: Once | INTRAVENOUS | Status: AC
  Administered 2015-08-06: 0.4 mg via INTRAVENOUS

## 2015-08-06 MED ORDER — AMINOPHYLLINE 25 MG/ML IV SOLN
75.0000 mg | Freq: Once | INTRAVENOUS | Status: AC
  Administered 2015-08-06: 75 mg via INTRAVENOUS

## 2015-08-06 MED ORDER — TECHNETIUM TC 99M SESTAMIBI GENERIC - CARDIOLITE
10.6000 | Freq: Once | INTRAVENOUS | Status: AC | PRN
  Administered 2015-08-06: 10.6 via INTRAVENOUS

## 2015-08-09 ENCOUNTER — Telehealth: Payer: Self-pay | Admitting: *Deleted

## 2015-08-09 NOTE — Telephone Encounter (Signed)
Spoke to patient.  Monitor and stress test Result given . Verbalized understanding

## 2015-08-09 NOTE — Telephone Encounter (Signed)
-----   Message from Skeet Latch, MD sent at 08/06/2015  5:57 PM EST ----- Monitor showed occasional early beats from the top of the heart.  This is not dangerous.  Continue to monitor for now.

## 2015-08-09 NOTE — Telephone Encounter (Signed)
-----   Message from Skeet Latch, MD sent at 08/06/2015  5:56 PM EST ----- Normal stress test.

## 2015-08-09 NOTE — Telephone Encounter (Signed)
LEFT MESSAGE TO CALL BACK - REGARDS TO TEST RESULTS

## 2015-11-07 ENCOUNTER — Telehealth: Payer: Self-pay | Admitting: *Deleted

## 2015-11-07 NOTE — Telephone Encounter (Signed)
Left message to call back  

## 2015-11-07 NOTE — Telephone Encounter (Signed)
-----   Message from Skeet Latch, MD sent at 11/07/2015  1:20 PM EDT ----- Patient was supposed to have 3 month follow up in March.  Please call to schedule.  We need to dscuss Xarelto options given cost concerns.

## 2015-11-08 NOTE — Telephone Encounter (Signed)
Will forward to Dr Oval Linsey so she will be aware   Hey---I just spoke with Alexandra Higgins and she does not want to come back to see Dr. Oval Linsey. She states she is doing ok.  ----- Message -----   From: Roland Earl   Sent: 11/08/2015 10:39 AM

## 2015-12-24 DIAGNOSIS — J209 Acute bronchitis, unspecified: Secondary | ICD-10-CM | POA: Diagnosis not present

## 2015-12-24 DIAGNOSIS — R05 Cough: Secondary | ICD-10-CM | POA: Diagnosis not present

## 2015-12-24 DIAGNOSIS — R69 Illness, unspecified: Secondary | ICD-10-CM | POA: Diagnosis not present

## 2015-12-24 DIAGNOSIS — Z7901 Long term (current) use of anticoagulants: Secondary | ICD-10-CM | POA: Diagnosis not present

## 2016-01-28 DIAGNOSIS — H5712 Ocular pain, left eye: Secondary | ICD-10-CM | POA: Diagnosis not present

## 2016-01-28 DIAGNOSIS — Z961 Presence of intraocular lens: Secondary | ICD-10-CM | POA: Diagnosis not present

## 2016-01-28 DIAGNOSIS — H01025 Squamous blepharitis left lower eyelid: Secondary | ICD-10-CM | POA: Diagnosis not present

## 2016-01-28 DIAGNOSIS — H01024 Squamous blepharitis left upper eyelid: Secondary | ICD-10-CM | POA: Diagnosis not present

## 2016-01-28 DIAGNOSIS — H01022 Squamous blepharitis right lower eyelid: Secondary | ICD-10-CM | POA: Diagnosis not present

## 2016-01-28 DIAGNOSIS — H04123 Dry eye syndrome of bilateral lacrimal glands: Secondary | ICD-10-CM | POA: Diagnosis not present

## 2016-01-28 DIAGNOSIS — H01021 Squamous blepharitis right upper eyelid: Secondary | ICD-10-CM | POA: Diagnosis not present

## 2016-01-29 DIAGNOSIS — M7072 Other bursitis of hip, left hip: Secondary | ICD-10-CM | POA: Diagnosis not present

## 2016-01-29 DIAGNOSIS — M47816 Spondylosis without myelopathy or radiculopathy, lumbar region: Secondary | ICD-10-CM | POA: Diagnosis not present

## 2016-01-29 DIAGNOSIS — M65332 Trigger finger, left middle finger: Secondary | ICD-10-CM | POA: Diagnosis not present

## 2016-02-10 DIAGNOSIS — M549 Dorsalgia, unspecified: Secondary | ICD-10-CM | POA: Diagnosis not present

## 2016-02-10 DIAGNOSIS — M47816 Spondylosis without myelopathy or radiculopathy, lumbar region: Secondary | ICD-10-CM | POA: Diagnosis not present

## 2016-02-10 DIAGNOSIS — M5127 Other intervertebral disc displacement, lumbosacral region: Secondary | ICD-10-CM | POA: Diagnosis not present

## 2016-02-26 DIAGNOSIS — M47816 Spondylosis without myelopathy or radiculopathy, lumbar region: Secondary | ICD-10-CM | POA: Diagnosis not present

## 2016-02-26 DIAGNOSIS — M545 Low back pain: Secondary | ICD-10-CM | POA: Diagnosis not present

## 2016-03-09 DIAGNOSIS — M47816 Spondylosis without myelopathy or radiculopathy, lumbar region: Secondary | ICD-10-CM | POA: Diagnosis not present

## 2016-03-09 DIAGNOSIS — M545 Low back pain: Secondary | ICD-10-CM | POA: Diagnosis not present

## 2016-03-22 IMAGING — NM NM MISC PROCEDURE
4 series · 24 of 24 positions shown · non-contrast
Comparison: none

[Series 1: wbr_r-proj_st wbr rest · 6.40mm/px · 6 of 64 frames shown]
[frame 6/64]
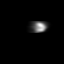
[frame 16/64]
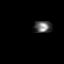
[frame 27/64]
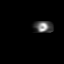
[frame 38/64]
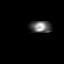
[frame 48/64]
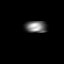
[frame 59/64]
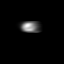

[Series 1: wbr rest · 6.40mm/px · 6 of 64 frames shown]
[frame 6/64]
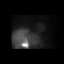
[frame 16/64]
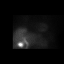
[frame 27/64]
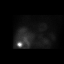
[frame 38/64]
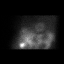
[frame 48/64]
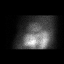
[frame 59/64]
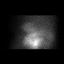

[Series 2: wbr stress ng · 6.40mm/px · 6 of 64 frames shown]
[frame 6/64]
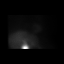
[frame 16/64]
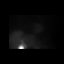
[frame 27/64]
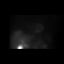
[frame 38/64]
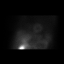
[frame 48/64]
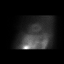
[frame 59/64]
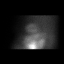

[Series 2: wbr_s-proj_st wbr stress ng · 6.40mm/px · 6 of 64 frames shown]
[frame 6/64]
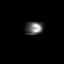
[frame 16/64]
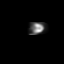
[frame 27/64]
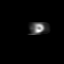
[frame 38/64]
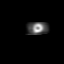
[frame 48/64]
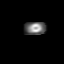
[frame 59/64]
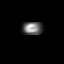

[24 of 24 positions shown; findings below may reference images not displayed]

Canned report from images found in remote index.

Refer to host system for actual result text.

## 2016-04-01 DIAGNOSIS — M810 Age-related osteoporosis without current pathological fracture: Secondary | ICD-10-CM | POA: Diagnosis not present

## 2016-04-01 DIAGNOSIS — I491 Atrial premature depolarization: Secondary | ICD-10-CM | POA: Diagnosis not present

## 2016-04-01 DIAGNOSIS — E038 Other specified hypothyroidism: Secondary | ICD-10-CM | POA: Diagnosis not present

## 2016-04-01 DIAGNOSIS — Z23 Encounter for immunization: Secondary | ICD-10-CM | POA: Diagnosis not present

## 2016-04-01 DIAGNOSIS — M546 Pain in thoracic spine: Secondary | ICD-10-CM | POA: Diagnosis not present

## 2016-04-01 DIAGNOSIS — Z7901 Long term (current) use of anticoagulants: Secondary | ICD-10-CM | POA: Diagnosis not present

## 2016-04-01 DIAGNOSIS — R69 Illness, unspecified: Secondary | ICD-10-CM | POA: Diagnosis not present

## 2016-04-08 DIAGNOSIS — L03031 Cellulitis of right toe: Secondary | ICD-10-CM | POA: Diagnosis not present

## 2016-04-08 DIAGNOSIS — Z08 Encounter for follow-up examination after completed treatment for malignant neoplasm: Secondary | ICD-10-CM | POA: Diagnosis not present

## 2016-04-08 DIAGNOSIS — X32XXXA Exposure to sunlight, initial encounter: Secondary | ICD-10-CM | POA: Diagnosis not present

## 2016-04-08 DIAGNOSIS — Z85828 Personal history of other malignant neoplasm of skin: Secondary | ICD-10-CM | POA: Diagnosis not present

## 2016-04-08 DIAGNOSIS — L570 Actinic keratosis: Secondary | ICD-10-CM | POA: Diagnosis not present

## 2016-04-29 DIAGNOSIS — M47816 Spondylosis without myelopathy or radiculopathy, lumbar region: Secondary | ICD-10-CM | POA: Diagnosis not present

## 2016-04-29 DIAGNOSIS — M810 Age-related osteoporosis without current pathological fracture: Secondary | ICD-10-CM | POA: Diagnosis not present

## 2016-05-27 DIAGNOSIS — H9193 Unspecified hearing loss, bilateral: Secondary | ICD-10-CM | POA: Diagnosis not present

## 2016-05-27 DIAGNOSIS — H6123 Impacted cerumen, bilateral: Secondary | ICD-10-CM | POA: Diagnosis not present

## 2016-06-08 DIAGNOSIS — I499 Cardiac arrhythmia, unspecified: Secondary | ICD-10-CM | POA: Diagnosis not present

## 2016-06-08 DIAGNOSIS — Z8701 Personal history of pneumonia (recurrent): Secondary | ICD-10-CM | POA: Diagnosis not present

## 2016-06-08 DIAGNOSIS — R918 Other nonspecific abnormal finding of lung field: Secondary | ICD-10-CM | POA: Diagnosis not present

## 2016-06-08 DIAGNOSIS — J180 Bronchopneumonia, unspecified organism: Secondary | ICD-10-CM | POA: Diagnosis not present

## 2016-06-09 DIAGNOSIS — R000 Tachycardia, unspecified: Secondary | ICD-10-CM | POA: Diagnosis not present

## 2016-06-16 DIAGNOSIS — R918 Other nonspecific abnormal finding of lung field: Secondary | ICD-10-CM | POA: Diagnosis not present

## 2016-06-16 DIAGNOSIS — R05 Cough: Secondary | ICD-10-CM | POA: Diagnosis not present

## 2016-06-16 DIAGNOSIS — R42 Dizziness and giddiness: Secondary | ICD-10-CM | POA: Diagnosis not present

## 2016-06-16 DIAGNOSIS — J180 Bronchopneumonia, unspecified organism: Secondary | ICD-10-CM | POA: Diagnosis not present

## 2016-06-16 DIAGNOSIS — R000 Tachycardia, unspecified: Secondary | ICD-10-CM | POA: Diagnosis not present

## 2016-06-16 DIAGNOSIS — J189 Pneumonia, unspecified organism: Secondary | ICD-10-CM | POA: Diagnosis not present

## 2016-07-03 DIAGNOSIS — N816 Rectocele: Secondary | ICD-10-CM | POA: Diagnosis not present

## 2016-07-03 DIAGNOSIS — N8111 Cystocele, midline: Secondary | ICD-10-CM | POA: Diagnosis not present

## 2016-07-07 DIAGNOSIS — Z7901 Long term (current) use of anticoagulants: Secondary | ICD-10-CM | POA: Diagnosis not present

## 2016-07-07 DIAGNOSIS — Z13220 Encounter for screening for lipoid disorders: Secondary | ICD-10-CM | POA: Diagnosis not present

## 2016-07-07 DIAGNOSIS — Z882 Allergy status to sulfonamides status: Secondary | ICD-10-CM | POA: Diagnosis not present

## 2016-07-07 DIAGNOSIS — G2581 Restless legs syndrome: Secondary | ICD-10-CM | POA: Diagnosis not present

## 2016-07-07 DIAGNOSIS — Z23 Encounter for immunization: Secondary | ICD-10-CM | POA: Diagnosis not present

## 2016-07-07 DIAGNOSIS — M810 Age-related osteoporosis without current pathological fracture: Secondary | ICD-10-CM | POA: Diagnosis not present

## 2016-07-07 DIAGNOSIS — Z79899 Other long term (current) drug therapy: Secondary | ICD-10-CM | POA: Diagnosis not present

## 2016-07-07 DIAGNOSIS — K5904 Chronic idiopathic constipation: Secondary | ICD-10-CM | POA: Diagnosis not present

## 2016-07-07 DIAGNOSIS — J180 Bronchopneumonia, unspecified organism: Secondary | ICD-10-CM | POA: Diagnosis not present

## 2016-07-07 DIAGNOSIS — Z1231 Encounter for screening mammogram for malignant neoplasm of breast: Secondary | ICD-10-CM | POA: Diagnosis not present

## 2016-07-14 DIAGNOSIS — I2699 Other pulmonary embolism without acute cor pulmonale: Secondary | ICD-10-CM | POA: Diagnosis not present

## 2016-07-14 DIAGNOSIS — K5900 Constipation, unspecified: Secondary | ICD-10-CM | POA: Diagnosis not present

## 2016-07-14 DIAGNOSIS — Z7901 Long term (current) use of anticoagulants: Secondary | ICD-10-CM | POA: Diagnosis not present

## 2016-07-14 DIAGNOSIS — N3281 Overactive bladder: Secondary | ICD-10-CM | POA: Diagnosis not present

## 2016-07-14 DIAGNOSIS — G2581 Restless legs syndrome: Secondary | ICD-10-CM | POA: Diagnosis not present

## 2016-07-14 DIAGNOSIS — M810 Age-related osteoporosis without current pathological fracture: Secondary | ICD-10-CM | POA: Diagnosis not present

## 2016-07-14 DIAGNOSIS — I517 Cardiomegaly: Secondary | ICD-10-CM | POA: Diagnosis not present

## 2016-07-14 DIAGNOSIS — Z6833 Body mass index (BMI) 33.0-33.9, adult: Secondary | ICD-10-CM | POA: Diagnosis not present

## 2016-07-14 DIAGNOSIS — E038 Other specified hypothyroidism: Secondary | ICD-10-CM | POA: Diagnosis not present

## 2016-07-14 DIAGNOSIS — M546 Pain in thoracic spine: Secondary | ICD-10-CM | POA: Diagnosis not present

## 2016-07-28 DIAGNOSIS — M8589 Other specified disorders of bone density and structure, multiple sites: Secondary | ICD-10-CM | POA: Diagnosis not present

## 2016-07-28 DIAGNOSIS — Z1231 Encounter for screening mammogram for malignant neoplasm of breast: Secondary | ICD-10-CM | POA: Diagnosis not present

## 2016-07-28 DIAGNOSIS — M810 Age-related osteoporosis without current pathological fracture: Secondary | ICD-10-CM | POA: Diagnosis not present

## 2016-09-02 DIAGNOSIS — M25512 Pain in left shoulder: Secondary | ICD-10-CM | POA: Diagnosis not present

## 2016-09-02 DIAGNOSIS — M5033 Other cervical disc degeneration, cervicothoracic region: Secondary | ICD-10-CM | POA: Diagnosis not present

## 2016-09-02 DIAGNOSIS — M7582 Other shoulder lesions, left shoulder: Secondary | ICD-10-CM | POA: Diagnosis not present

## 2016-09-30 DIAGNOSIS — G8929 Other chronic pain: Secondary | ICD-10-CM | POA: Diagnosis not present

## 2016-09-30 DIAGNOSIS — M5136 Other intervertebral disc degeneration, lumbar region: Secondary | ICD-10-CM | POA: Diagnosis not present

## 2016-09-30 DIAGNOSIS — Z8781 Personal history of (healed) traumatic fracture: Secondary | ICD-10-CM | POA: Diagnosis not present

## 2016-09-30 DIAGNOSIS — M810 Age-related osteoporosis without current pathological fracture: Secondary | ICD-10-CM | POA: Diagnosis not present

## 2016-09-30 DIAGNOSIS — M545 Low back pain: Secondary | ICD-10-CM | POA: Diagnosis not present

## 2016-10-02 DIAGNOSIS — R197 Diarrhea, unspecified: Secondary | ICD-10-CM | POA: Diagnosis not present

## 2016-10-02 DIAGNOSIS — R1030 Lower abdominal pain, unspecified: Secondary | ICD-10-CM | POA: Diagnosis not present

## 2016-10-02 DIAGNOSIS — J9811 Atelectasis: Secondary | ICD-10-CM | POA: Diagnosis not present

## 2016-10-02 DIAGNOSIS — Z13220 Encounter for screening for lipoid disorders: Secondary | ICD-10-CM | POA: Diagnosis not present

## 2016-10-03 DIAGNOSIS — Z13220 Encounter for screening for lipoid disorders: Secondary | ICD-10-CM | POA: Diagnosis not present

## 2016-10-03 DIAGNOSIS — R197 Diarrhea, unspecified: Secondary | ICD-10-CM | POA: Diagnosis not present

## 2016-10-03 DIAGNOSIS — R1030 Lower abdominal pain, unspecified: Secondary | ICD-10-CM | POA: Diagnosis not present

## 2016-10-14 DIAGNOSIS — Z86711 Personal history of pulmonary embolism: Secondary | ICD-10-CM | POA: Diagnosis not present

## 2016-10-14 DIAGNOSIS — M5136 Other intervertebral disc degeneration, lumbar region: Secondary | ICD-10-CM | POA: Diagnosis not present

## 2016-10-14 DIAGNOSIS — G2581 Restless legs syndrome: Secondary | ICD-10-CM | POA: Diagnosis not present

## 2016-10-14 DIAGNOSIS — E039 Hypothyroidism, unspecified: Secondary | ICD-10-CM | POA: Diagnosis not present

## 2016-10-14 DIAGNOSIS — Z8781 Personal history of (healed) traumatic fracture: Secondary | ICD-10-CM | POA: Diagnosis not present

## 2016-10-14 DIAGNOSIS — R69 Illness, unspecified: Secondary | ICD-10-CM | POA: Diagnosis not present

## 2016-10-14 DIAGNOSIS — M545 Low back pain: Secondary | ICD-10-CM | POA: Diagnosis not present

## 2016-10-14 DIAGNOSIS — G8929 Other chronic pain: Secondary | ICD-10-CM | POA: Diagnosis not present

## 2016-10-14 DIAGNOSIS — M8588 Other specified disorders of bone density and structure, other site: Secondary | ICD-10-CM | POA: Diagnosis not present

## 2016-10-14 DIAGNOSIS — M40295 Other kyphosis, thoracolumbar region: Secondary | ICD-10-CM | POA: Diagnosis not present

## 2016-10-14 DIAGNOSIS — R8271 Bacteriuria: Secondary | ICD-10-CM | POA: Diagnosis not present

## 2016-10-14 DIAGNOSIS — M47816 Spondylosis without myelopathy or radiculopathy, lumbar region: Secondary | ICD-10-CM | POA: Diagnosis not present

## 2016-10-14 DIAGNOSIS — M549 Dorsalgia, unspecified: Secondary | ICD-10-CM | POA: Diagnosis not present

## 2016-10-14 DIAGNOSIS — R197 Diarrhea, unspecified: Secondary | ICD-10-CM | POA: Diagnosis not present

## 2016-10-14 DIAGNOSIS — R1032 Left lower quadrant pain: Secondary | ICD-10-CM | POA: Diagnosis not present

## 2016-10-15 DIAGNOSIS — K5900 Constipation, unspecified: Secondary | ICD-10-CM | POA: Diagnosis not present

## 2016-10-15 DIAGNOSIS — R197 Diarrhea, unspecified: Secondary | ICD-10-CM | POA: Diagnosis not present

## 2016-10-15 DIAGNOSIS — R1032 Left lower quadrant pain: Secondary | ICD-10-CM | POA: Diagnosis not present

## 2016-10-15 DIAGNOSIS — K429 Umbilical hernia without obstruction or gangrene: Secondary | ICD-10-CM | POA: Diagnosis not present

## 2016-10-15 DIAGNOSIS — M810 Age-related osteoporosis without current pathological fracture: Secondary | ICD-10-CM | POA: Diagnosis not present

## 2016-10-16 DIAGNOSIS — R933 Abnormal findings on diagnostic imaging of other parts of digestive tract: Secondary | ICD-10-CM | POA: Diagnosis not present

## 2016-10-16 DIAGNOSIS — R197 Diarrhea, unspecified: Secondary | ICD-10-CM | POA: Diagnosis not present

## 2016-10-16 DIAGNOSIS — R110 Nausea: Secondary | ICD-10-CM | POA: Diagnosis not present

## 2016-10-19 DIAGNOSIS — Z7901 Long term (current) use of anticoagulants: Secondary | ICD-10-CM | POA: Diagnosis not present

## 2016-10-19 DIAGNOSIS — Z6830 Body mass index (BMI) 30.0-30.9, adult: Secondary | ICD-10-CM | POA: Diagnosis not present

## 2016-10-19 DIAGNOSIS — Z79899 Other long term (current) drug therapy: Secondary | ICD-10-CM | POA: Diagnosis not present

## 2016-10-19 DIAGNOSIS — Z86718 Personal history of other venous thrombosis and embolism: Secondary | ICD-10-CM | POA: Diagnosis not present

## 2016-10-19 DIAGNOSIS — C250 Malignant neoplasm of head of pancreas: Secondary | ICD-10-CM | POA: Diagnosis not present

## 2016-10-19 DIAGNOSIS — K8689 Other specified diseases of pancreas: Secondary | ICD-10-CM | POA: Diagnosis not present

## 2016-10-19 DIAGNOSIS — C259 Malignant neoplasm of pancreas, unspecified: Secondary | ICD-10-CM | POA: Diagnosis not present

## 2016-10-19 DIAGNOSIS — R69 Illness, unspecified: Secondary | ICD-10-CM | POA: Diagnosis not present

## 2016-10-19 DIAGNOSIS — E669 Obesity, unspecified: Secondary | ICD-10-CM | POA: Diagnosis not present

## 2016-10-19 DIAGNOSIS — Z882 Allergy status to sulfonamides status: Secondary | ICD-10-CM | POA: Diagnosis not present

## 2016-10-19 DIAGNOSIS — R933 Abnormal findings on diagnostic imaging of other parts of digestive tract: Secondary | ICD-10-CM | POA: Diagnosis not present

## 2016-10-21 DIAGNOSIS — D125 Benign neoplasm of sigmoid colon: Secondary | ICD-10-CM | POA: Diagnosis not present

## 2016-10-21 DIAGNOSIS — R933 Abnormal findings on diagnostic imaging of other parts of digestive tract: Secondary | ICD-10-CM | POA: Diagnosis not present

## 2016-10-21 DIAGNOSIS — D123 Benign neoplasm of transverse colon: Secondary | ICD-10-CM | POA: Diagnosis not present

## 2016-10-21 DIAGNOSIS — R197 Diarrhea, unspecified: Secondary | ICD-10-CM | POA: Diagnosis not present

## 2016-10-21 DIAGNOSIS — D122 Benign neoplasm of ascending colon: Secondary | ICD-10-CM | POA: Diagnosis not present

## 2016-10-22 DIAGNOSIS — J22 Unspecified acute lower respiratory infection: Secondary | ICD-10-CM | POA: Diagnosis not present

## 2016-10-22 DIAGNOSIS — J8410 Pulmonary fibrosis, unspecified: Secondary | ICD-10-CM | POA: Diagnosis not present

## 2016-10-22 DIAGNOSIS — Z86711 Personal history of pulmonary embolism: Secondary | ICD-10-CM | POA: Diagnosis not present

## 2016-10-22 DIAGNOSIS — J40 Bronchitis, not specified as acute or chronic: Secondary | ICD-10-CM | POA: Diagnosis not present

## 2016-10-22 DIAGNOSIS — C259 Malignant neoplasm of pancreas, unspecified: Secondary | ICD-10-CM | POA: Diagnosis not present

## 2016-10-23 DIAGNOSIS — K8689 Other specified diseases of pancreas: Secondary | ICD-10-CM | POA: Diagnosis not present

## 2016-10-23 DIAGNOSIS — C250 Malignant neoplasm of head of pancreas: Secondary | ICD-10-CM | POA: Diagnosis not present

## 2016-10-26 DIAGNOSIS — C259 Malignant neoplasm of pancreas, unspecified: Secondary | ICD-10-CM | POA: Diagnosis not present

## 2016-10-26 DIAGNOSIS — J181 Lobar pneumonia, unspecified organism: Secondary | ICD-10-CM | POA: Diagnosis not present

## 2016-10-27 DIAGNOSIS — Z510 Encounter for antineoplastic radiation therapy: Secondary | ICD-10-CM | POA: Diagnosis not present

## 2016-10-27 DIAGNOSIS — C250 Malignant neoplasm of head of pancreas: Secondary | ICD-10-CM | POA: Diagnosis not present

## 2016-10-28 DIAGNOSIS — J439 Emphysema, unspecified: Secondary | ICD-10-CM | POA: Diagnosis not present

## 2016-10-28 DIAGNOSIS — C250 Malignant neoplasm of head of pancreas: Secondary | ICD-10-CM | POA: Diagnosis not present

## 2016-11-05 DIAGNOSIS — Z510 Encounter for antineoplastic radiation therapy: Secondary | ICD-10-CM | POA: Diagnosis not present

## 2016-11-05 DIAGNOSIS — C250 Malignant neoplasm of head of pancreas: Secondary | ICD-10-CM | POA: Diagnosis not present

## 2016-11-11 DIAGNOSIS — M79605 Pain in left leg: Secondary | ICD-10-CM | POA: Diagnosis not present

## 2016-11-11 DIAGNOSIS — C250 Malignant neoplasm of head of pancreas: Secondary | ICD-10-CM | POA: Diagnosis not present

## 2016-11-12 DIAGNOSIS — G4700 Insomnia, unspecified: Secondary | ICD-10-CM | POA: Diagnosis not present

## 2016-11-12 DIAGNOSIS — M25572 Pain in left ankle and joints of left foot: Secondary | ICD-10-CM | POA: Diagnosis not present

## 2016-11-12 DIAGNOSIS — G8929 Other chronic pain: Secondary | ICD-10-CM | POA: Diagnosis not present

## 2016-11-12 DIAGNOSIS — Z510 Encounter for antineoplastic radiation therapy: Secondary | ICD-10-CM | POA: Diagnosis not present

## 2016-11-12 DIAGNOSIS — K909 Intestinal malabsorption, unspecified: Secondary | ICD-10-CM | POA: Diagnosis not present

## 2016-11-12 DIAGNOSIS — M549 Dorsalgia, unspecified: Secondary | ICD-10-CM | POA: Diagnosis not present

## 2016-11-12 DIAGNOSIS — C250 Malignant neoplasm of head of pancreas: Secondary | ICD-10-CM | POA: Diagnosis not present

## 2016-11-12 DIAGNOSIS — R197 Diarrhea, unspecified: Secondary | ICD-10-CM | POA: Diagnosis not present

## 2016-11-12 DIAGNOSIS — R918 Other nonspecific abnormal finding of lung field: Secondary | ICD-10-CM | POA: Diagnosis not present

## 2016-11-13 DIAGNOSIS — C250 Malignant neoplasm of head of pancreas: Secondary | ICD-10-CM | POA: Diagnosis not present

## 2016-11-13 DIAGNOSIS — Z510 Encounter for antineoplastic radiation therapy: Secondary | ICD-10-CM | POA: Diagnosis not present

## 2016-11-16 DIAGNOSIS — Z510 Encounter for antineoplastic radiation therapy: Secondary | ICD-10-CM | POA: Diagnosis not present

## 2016-11-16 DIAGNOSIS — C250 Malignant neoplasm of head of pancreas: Secondary | ICD-10-CM | POA: Diagnosis not present

## 2016-11-17 DIAGNOSIS — C250 Malignant neoplasm of head of pancreas: Secondary | ICD-10-CM | POA: Diagnosis not present

## 2016-11-17 DIAGNOSIS — Z510 Encounter for antineoplastic radiation therapy: Secondary | ICD-10-CM | POA: Diagnosis not present

## 2016-11-18 DIAGNOSIS — Z510 Encounter for antineoplastic radiation therapy: Secondary | ICD-10-CM | POA: Diagnosis not present

## 2016-11-18 DIAGNOSIS — C250 Malignant neoplasm of head of pancreas: Secondary | ICD-10-CM | POA: Diagnosis not present

## 2016-11-19 DIAGNOSIS — Z510 Encounter for antineoplastic radiation therapy: Secondary | ICD-10-CM | POA: Diagnosis not present

## 2016-11-19 DIAGNOSIS — R918 Other nonspecific abnormal finding of lung field: Secondary | ICD-10-CM | POA: Diagnosis not present

## 2016-11-19 DIAGNOSIS — R110 Nausea: Secondary | ICD-10-CM | POA: Diagnosis not present

## 2016-11-19 DIAGNOSIS — M109 Gout, unspecified: Secondary | ICD-10-CM | POA: Diagnosis not present

## 2016-11-19 DIAGNOSIS — M549 Dorsalgia, unspecified: Secondary | ICD-10-CM | POA: Diagnosis not present

## 2016-11-19 DIAGNOSIS — K5903 Drug induced constipation: Secondary | ICD-10-CM | POA: Diagnosis not present

## 2016-11-19 DIAGNOSIS — G8929 Other chronic pain: Secondary | ICD-10-CM | POA: Diagnosis not present

## 2016-11-19 DIAGNOSIS — G893 Neoplasm related pain (acute) (chronic): Secondary | ICD-10-CM | POA: Diagnosis not present

## 2016-11-19 DIAGNOSIS — C250 Malignant neoplasm of head of pancreas: Secondary | ICD-10-CM | POA: Diagnosis not present

## 2016-11-19 DIAGNOSIS — T451X5A Adverse effect of antineoplastic and immunosuppressive drugs, initial encounter: Secondary | ICD-10-CM | POA: Diagnosis not present

## 2016-11-19 DIAGNOSIS — G4700 Insomnia, unspecified: Secondary | ICD-10-CM | POA: Diagnosis not present

## 2016-11-20 DIAGNOSIS — C250 Malignant neoplasm of head of pancreas: Secondary | ICD-10-CM | POA: Diagnosis not present

## 2016-11-20 DIAGNOSIS — Z510 Encounter for antineoplastic radiation therapy: Secondary | ICD-10-CM | POA: Diagnosis not present

## 2016-11-23 DIAGNOSIS — Z510 Encounter for antineoplastic radiation therapy: Secondary | ICD-10-CM | POA: Diagnosis not present

## 2016-11-23 DIAGNOSIS — C250 Malignant neoplasm of head of pancreas: Secondary | ICD-10-CM | POA: Diagnosis not present

## 2016-11-24 DIAGNOSIS — C250 Malignant neoplasm of head of pancreas: Secondary | ICD-10-CM | POA: Diagnosis not present

## 2016-11-24 DIAGNOSIS — Z510 Encounter for antineoplastic radiation therapy: Secondary | ICD-10-CM | POA: Diagnosis not present

## 2016-11-25 DIAGNOSIS — Z86711 Personal history of pulmonary embolism: Secondary | ICD-10-CM | POA: Diagnosis not present

## 2016-11-25 DIAGNOSIS — R195 Other fecal abnormalities: Secondary | ICD-10-CM | POA: Diagnosis not present

## 2016-11-25 DIAGNOSIS — C250 Malignant neoplasm of head of pancreas: Secondary | ICD-10-CM | POA: Diagnosis not present

## 2016-11-25 DIAGNOSIS — Z7901 Long term (current) use of anticoagulants: Secondary | ICD-10-CM | POA: Diagnosis not present

## 2016-11-25 DIAGNOSIS — Z510 Encounter for antineoplastic radiation therapy: Secondary | ICD-10-CM | POA: Diagnosis not present

## 2016-11-25 DIAGNOSIS — G893 Neoplasm related pain (acute) (chronic): Secondary | ICD-10-CM | POA: Diagnosis not present

## 2016-11-25 DIAGNOSIS — R110 Nausea: Secondary | ICD-10-CM | POA: Diagnosis not present

## 2016-11-25 DIAGNOSIS — G4700 Insomnia, unspecified: Secondary | ICD-10-CM | POA: Diagnosis not present

## 2016-11-26 DIAGNOSIS — Z510 Encounter for antineoplastic radiation therapy: Secondary | ICD-10-CM | POA: Diagnosis not present

## 2016-11-26 DIAGNOSIS — M109 Gout, unspecified: Secondary | ICD-10-CM | POA: Diagnosis not present

## 2016-11-26 DIAGNOSIS — C250 Malignant neoplasm of head of pancreas: Secondary | ICD-10-CM | POA: Diagnosis not present

## 2016-11-26 DIAGNOSIS — Z86711 Personal history of pulmonary embolism: Secondary | ICD-10-CM | POA: Diagnosis not present

## 2016-11-27 DIAGNOSIS — C250 Malignant neoplasm of head of pancreas: Secondary | ICD-10-CM | POA: Diagnosis not present

## 2016-11-27 DIAGNOSIS — Z510 Encounter for antineoplastic radiation therapy: Secondary | ICD-10-CM | POA: Diagnosis not present

## 2016-11-30 DIAGNOSIS — G2581 Restless legs syndrome: Secondary | ICD-10-CM | POA: Diagnosis not present

## 2016-11-30 DIAGNOSIS — G893 Neoplasm related pain (acute) (chronic): Secondary | ICD-10-CM | POA: Diagnosis not present

## 2016-11-30 DIAGNOSIS — M545 Low back pain: Secondary | ICD-10-CM | POA: Diagnosis not present

## 2016-11-30 DIAGNOSIS — G894 Chronic pain syndrome: Secondary | ICD-10-CM | POA: Diagnosis not present

## 2016-12-01 DIAGNOSIS — C250 Malignant neoplasm of head of pancreas: Secondary | ICD-10-CM | POA: Diagnosis not present

## 2016-12-01 DIAGNOSIS — Z510 Encounter for antineoplastic radiation therapy: Secondary | ICD-10-CM | POA: Diagnosis not present

## 2016-12-02 DIAGNOSIS — Z510 Encounter for antineoplastic radiation therapy: Secondary | ICD-10-CM | POA: Diagnosis not present

## 2016-12-02 DIAGNOSIS — C250 Malignant neoplasm of head of pancreas: Secondary | ICD-10-CM | POA: Diagnosis not present

## 2016-12-03 DIAGNOSIS — G8929 Other chronic pain: Secondary | ICD-10-CM | POA: Diagnosis not present

## 2016-12-03 DIAGNOSIS — G893 Neoplasm related pain (acute) (chronic): Secondary | ICD-10-CM | POA: Diagnosis not present

## 2016-12-03 DIAGNOSIS — T451X5A Adverse effect of antineoplastic and immunosuppressive drugs, initial encounter: Secondary | ICD-10-CM | POA: Diagnosis not present

## 2016-12-03 DIAGNOSIS — R110 Nausea: Secondary | ICD-10-CM | POA: Diagnosis not present

## 2016-12-03 DIAGNOSIS — M549 Dorsalgia, unspecified: Secondary | ICD-10-CM | POA: Diagnosis not present

## 2016-12-03 DIAGNOSIS — K5903 Drug induced constipation: Secondary | ICD-10-CM | POA: Diagnosis not present

## 2016-12-03 DIAGNOSIS — C250 Malignant neoplasm of head of pancreas: Secondary | ICD-10-CM | POA: Diagnosis not present

## 2016-12-03 DIAGNOSIS — Z86711 Personal history of pulmonary embolism: Secondary | ICD-10-CM | POA: Diagnosis not present

## 2016-12-03 DIAGNOSIS — G4700 Insomnia, unspecified: Secondary | ICD-10-CM | POA: Diagnosis not present

## 2016-12-03 DIAGNOSIS — Z510 Encounter for antineoplastic radiation therapy: Secondary | ICD-10-CM | POA: Diagnosis not present

## 2016-12-03 DIAGNOSIS — M109 Gout, unspecified: Secondary | ICD-10-CM | POA: Diagnosis not present

## 2016-12-04 DIAGNOSIS — Z510 Encounter for antineoplastic radiation therapy: Secondary | ICD-10-CM | POA: Diagnosis not present

## 2016-12-04 DIAGNOSIS — C250 Malignant neoplasm of head of pancreas: Secondary | ICD-10-CM | POA: Diagnosis not present

## 2016-12-07 DIAGNOSIS — Z510 Encounter for antineoplastic radiation therapy: Secondary | ICD-10-CM | POA: Diagnosis not present

## 2016-12-07 DIAGNOSIS — C250 Malignant neoplasm of head of pancreas: Secondary | ICD-10-CM | POA: Diagnosis not present

## 2016-12-08 DIAGNOSIS — C250 Malignant neoplasm of head of pancreas: Secondary | ICD-10-CM | POA: Diagnosis not present

## 2016-12-08 DIAGNOSIS — Z510 Encounter for antineoplastic radiation therapy: Secondary | ICD-10-CM | POA: Diagnosis not present

## 2016-12-09 DIAGNOSIS — Z510 Encounter for antineoplastic radiation therapy: Secondary | ICD-10-CM | POA: Diagnosis not present

## 2016-12-09 DIAGNOSIS — C250 Malignant neoplasm of head of pancreas: Secondary | ICD-10-CM | POA: Diagnosis not present

## 2016-12-10 DIAGNOSIS — Z510 Encounter for antineoplastic radiation therapy: Secondary | ICD-10-CM | POA: Diagnosis not present

## 2016-12-10 DIAGNOSIS — C250 Malignant neoplasm of head of pancreas: Secondary | ICD-10-CM | POA: Diagnosis not present

## 2016-12-11 DIAGNOSIS — C250 Malignant neoplasm of head of pancreas: Secondary | ICD-10-CM | POA: Diagnosis not present

## 2016-12-11 DIAGNOSIS — Z510 Encounter for antineoplastic radiation therapy: Secondary | ICD-10-CM | POA: Diagnosis not present

## 2016-12-14 DIAGNOSIS — C250 Malignant neoplasm of head of pancreas: Secondary | ICD-10-CM | POA: Diagnosis not present

## 2016-12-14 DIAGNOSIS — Z510 Encounter for antineoplastic radiation therapy: Secondary | ICD-10-CM | POA: Diagnosis not present

## 2016-12-15 DIAGNOSIS — C250 Malignant neoplasm of head of pancreas: Secondary | ICD-10-CM | POA: Diagnosis not present

## 2016-12-15 DIAGNOSIS — Z510 Encounter for antineoplastic radiation therapy: Secondary | ICD-10-CM | POA: Diagnosis not present

## 2016-12-16 DIAGNOSIS — C250 Malignant neoplasm of head of pancreas: Secondary | ICD-10-CM | POA: Diagnosis not present

## 2016-12-16 DIAGNOSIS — Z510 Encounter for antineoplastic radiation therapy: Secondary | ICD-10-CM | POA: Diagnosis not present

## 2016-12-17 DIAGNOSIS — Z510 Encounter for antineoplastic radiation therapy: Secondary | ICD-10-CM | POA: Diagnosis not present

## 2016-12-17 DIAGNOSIS — C250 Malignant neoplasm of head of pancreas: Secondary | ICD-10-CM | POA: Diagnosis not present

## 2016-12-18 DIAGNOSIS — Z510 Encounter for antineoplastic radiation therapy: Secondary | ICD-10-CM | POA: Diagnosis not present

## 2016-12-18 DIAGNOSIS — C250 Malignant neoplasm of head of pancreas: Secondary | ICD-10-CM | POA: Diagnosis not present

## 2016-12-21 DIAGNOSIS — Z510 Encounter for antineoplastic radiation therapy: Secondary | ICD-10-CM | POA: Diagnosis not present

## 2016-12-21 DIAGNOSIS — C250 Malignant neoplasm of head of pancreas: Secondary | ICD-10-CM | POA: Diagnosis not present

## 2016-12-22 DIAGNOSIS — Z510 Encounter for antineoplastic radiation therapy: Secondary | ICD-10-CM | POA: Diagnosis not present

## 2016-12-22 DIAGNOSIS — C250 Malignant neoplasm of head of pancreas: Secondary | ICD-10-CM | POA: Diagnosis not present

## 2016-12-24 DIAGNOSIS — R110 Nausea: Secondary | ICD-10-CM | POA: Diagnosis not present

## 2016-12-24 DIAGNOSIS — K5903 Drug induced constipation: Secondary | ICD-10-CM | POA: Diagnosis not present

## 2016-12-24 DIAGNOSIS — Z86711 Personal history of pulmonary embolism: Secondary | ICD-10-CM | POA: Diagnosis not present

## 2016-12-24 DIAGNOSIS — M109 Gout, unspecified: Secondary | ICD-10-CM | POA: Diagnosis not present

## 2016-12-24 DIAGNOSIS — G893 Neoplasm related pain (acute) (chronic): Secondary | ICD-10-CM | POA: Diagnosis not present

## 2016-12-24 DIAGNOSIS — M549 Dorsalgia, unspecified: Secondary | ICD-10-CM | POA: Diagnosis not present

## 2016-12-24 DIAGNOSIS — C250 Malignant neoplasm of head of pancreas: Secondary | ICD-10-CM | POA: Diagnosis not present

## 2016-12-24 DIAGNOSIS — G8929 Other chronic pain: Secondary | ICD-10-CM | POA: Diagnosis not present

## 2016-12-24 DIAGNOSIS — G4700 Insomnia, unspecified: Secondary | ICD-10-CM | POA: Diagnosis not present

## 2016-12-24 DIAGNOSIS — T451X5A Adverse effect of antineoplastic and immunosuppressive drugs, initial encounter: Secondary | ICD-10-CM | POA: Diagnosis not present

## 2017-01-06 DIAGNOSIS — M545 Low back pain: Secondary | ICD-10-CM | POA: Diagnosis not present

## 2017-01-06 DIAGNOSIS — G893 Neoplasm related pain (acute) (chronic): Secondary | ICD-10-CM | POA: Diagnosis not present

## 2017-01-06 DIAGNOSIS — C250 Malignant neoplasm of head of pancreas: Secondary | ICD-10-CM | POA: Diagnosis not present

## 2017-01-06 DIAGNOSIS — G2581 Restless legs syndrome: Secondary | ICD-10-CM | POA: Diagnosis not present

## 2017-01-08 DIAGNOSIS — Z79899 Other long term (current) drug therapy: Secondary | ICD-10-CM | POA: Diagnosis not present

## 2017-01-08 DIAGNOSIS — Z5181 Encounter for therapeutic drug level monitoring: Secondary | ICD-10-CM | POA: Diagnosis not present

## 2017-01-11 DIAGNOSIS — K449 Diaphragmatic hernia without obstruction or gangrene: Secondary | ICD-10-CM | POA: Diagnosis not present

## 2017-01-11 DIAGNOSIS — C250 Malignant neoplasm of head of pancreas: Secondary | ICD-10-CM | POA: Diagnosis not present

## 2017-01-11 DIAGNOSIS — J9811 Atelectasis: Secondary | ICD-10-CM | POA: Diagnosis not present

## 2017-01-11 DIAGNOSIS — N281 Cyst of kidney, acquired: Secondary | ICD-10-CM | POA: Diagnosis not present

## 2017-01-11 DIAGNOSIS — R1900 Intra-abdominal and pelvic swelling, mass and lump, unspecified site: Secondary | ICD-10-CM | POA: Diagnosis not present

## 2017-01-11 DIAGNOSIS — R918 Other nonspecific abnormal finding of lung field: Secondary | ICD-10-CM | POA: Diagnosis not present

## 2017-01-11 DIAGNOSIS — C259 Malignant neoplasm of pancreas, unspecified: Secondary | ICD-10-CM | POA: Diagnosis not present

## 2017-01-18 DIAGNOSIS — R110 Nausea: Secondary | ICD-10-CM | POA: Diagnosis not present

## 2017-01-18 DIAGNOSIS — T451X5A Adverse effect of antineoplastic and immunosuppressive drugs, initial encounter: Secondary | ICD-10-CM | POA: Diagnosis not present

## 2017-01-18 DIAGNOSIS — R197 Diarrhea, unspecified: Secondary | ICD-10-CM | POA: Diagnosis not present

## 2017-01-18 DIAGNOSIS — G893 Neoplasm related pain (acute) (chronic): Secondary | ICD-10-CM | POA: Diagnosis not present

## 2017-01-18 DIAGNOSIS — M109 Gout, unspecified: Secondary | ICD-10-CM | POA: Diagnosis not present

## 2017-01-18 DIAGNOSIS — Z5111 Encounter for antineoplastic chemotherapy: Secondary | ICD-10-CM | POA: Diagnosis not present

## 2017-01-18 DIAGNOSIS — G4700 Insomnia, unspecified: Secondary | ICD-10-CM | POA: Diagnosis not present

## 2017-01-18 DIAGNOSIS — M549 Dorsalgia, unspecified: Secondary | ICD-10-CM | POA: Diagnosis not present

## 2017-01-18 DIAGNOSIS — G8929 Other chronic pain: Secondary | ICD-10-CM | POA: Diagnosis not present

## 2017-01-18 DIAGNOSIS — Z86711 Personal history of pulmonary embolism: Secondary | ICD-10-CM | POA: Diagnosis not present

## 2017-01-18 DIAGNOSIS — C250 Malignant neoplasm of head of pancreas: Secondary | ICD-10-CM | POA: Diagnosis not present

## 2017-01-25 DIAGNOSIS — C250 Malignant neoplasm of head of pancreas: Secondary | ICD-10-CM | POA: Diagnosis not present

## 2017-01-26 DIAGNOSIS — Z5111 Encounter for antineoplastic chemotherapy: Secondary | ICD-10-CM | POA: Diagnosis not present

## 2017-01-26 DIAGNOSIS — Z452 Encounter for adjustment and management of vascular access device: Secondary | ICD-10-CM | POA: Diagnosis not present

## 2017-01-26 DIAGNOSIS — C250 Malignant neoplasm of head of pancreas: Secondary | ICD-10-CM | POA: Diagnosis not present

## 2017-01-26 DIAGNOSIS — C259 Malignant neoplasm of pancreas, unspecified: Secondary | ICD-10-CM | POA: Diagnosis not present

## 2017-01-27 DIAGNOSIS — Z86711 Personal history of pulmonary embolism: Secondary | ICD-10-CM | POA: Diagnosis not present

## 2017-01-27 DIAGNOSIS — Z5111 Encounter for antineoplastic chemotherapy: Secondary | ICD-10-CM | POA: Diagnosis not present

## 2017-01-27 DIAGNOSIS — C250 Malignant neoplasm of head of pancreas: Secondary | ICD-10-CM | POA: Diagnosis not present

## 2017-01-29 DIAGNOSIS — G893 Neoplasm related pain (acute) (chronic): Secondary | ICD-10-CM | POA: Diagnosis not present

## 2017-01-29 DIAGNOSIS — C250 Malignant neoplasm of head of pancreas: Secondary | ICD-10-CM | POA: Diagnosis not present

## 2017-01-29 DIAGNOSIS — G2581 Restless legs syndrome: Secondary | ICD-10-CM | POA: Diagnosis not present

## 2017-01-29 DIAGNOSIS — M545 Low back pain: Secondary | ICD-10-CM | POA: Diagnosis not present

## 2017-02-10 DIAGNOSIS — G4700 Insomnia, unspecified: Secondary | ICD-10-CM | POA: Diagnosis not present

## 2017-02-10 DIAGNOSIS — R197 Diarrhea, unspecified: Secondary | ICD-10-CM | POA: Diagnosis not present

## 2017-02-10 DIAGNOSIS — G8929 Other chronic pain: Secondary | ICD-10-CM | POA: Diagnosis not present

## 2017-02-10 DIAGNOSIS — M549 Dorsalgia, unspecified: Secondary | ICD-10-CM | POA: Diagnosis not present

## 2017-02-10 DIAGNOSIS — C250 Malignant neoplasm of head of pancreas: Secondary | ICD-10-CM | POA: Diagnosis not present

## 2017-02-10 DIAGNOSIS — G893 Neoplasm related pain (acute) (chronic): Secondary | ICD-10-CM | POA: Diagnosis not present

## 2017-02-10 DIAGNOSIS — Z5111 Encounter for antineoplastic chemotherapy: Secondary | ICD-10-CM | POA: Diagnosis not present

## 2017-02-10 DIAGNOSIS — T451X5A Adverse effect of antineoplastic and immunosuppressive drugs, initial encounter: Secondary | ICD-10-CM | POA: Diagnosis not present

## 2017-02-10 DIAGNOSIS — R918 Other nonspecific abnormal finding of lung field: Secondary | ICD-10-CM | POA: Diagnosis not present

## 2017-02-10 DIAGNOSIS — R110 Nausea: Secondary | ICD-10-CM | POA: Diagnosis not present

## 2017-02-12 DIAGNOSIS — C250 Malignant neoplasm of head of pancreas: Secondary | ICD-10-CM | POA: Diagnosis not present

## 2017-02-15 DIAGNOSIS — C259 Malignant neoplasm of pancreas, unspecified: Secondary | ICD-10-CM | POA: Diagnosis not present

## 2017-02-15 DIAGNOSIS — R935 Abnormal findings on diagnostic imaging of other abdominal regions, including retroperitoneum: Secondary | ICD-10-CM | POA: Diagnosis not present

## 2017-02-15 DIAGNOSIS — Z131 Encounter for screening for diabetes mellitus: Secondary | ICD-10-CM | POA: Diagnosis not present

## 2017-02-15 DIAGNOSIS — Z129 Encounter for screening for malignant neoplasm, site unspecified: Secondary | ICD-10-CM | POA: Diagnosis not present

## 2017-02-16 DIAGNOSIS — M109 Gout, unspecified: Secondary | ICD-10-CM | POA: Diagnosis not present

## 2017-02-16 DIAGNOSIS — C250 Malignant neoplasm of head of pancreas: Secondary | ICD-10-CM | POA: Diagnosis not present

## 2017-02-24 DIAGNOSIS — T451X5A Adverse effect of antineoplastic and immunosuppressive drugs, initial encounter: Secondary | ICD-10-CM | POA: Diagnosis not present

## 2017-02-24 DIAGNOSIS — R110 Nausea: Secondary | ICD-10-CM | POA: Diagnosis not present

## 2017-02-24 DIAGNOSIS — R197 Diarrhea, unspecified: Secondary | ICD-10-CM | POA: Diagnosis not present

## 2017-02-24 DIAGNOSIS — G8929 Other chronic pain: Secondary | ICD-10-CM | POA: Diagnosis not present

## 2017-02-24 DIAGNOSIS — C250 Malignant neoplasm of head of pancreas: Secondary | ICD-10-CM | POA: Diagnosis not present

## 2017-02-24 DIAGNOSIS — R918 Other nonspecific abnormal finding of lung field: Secondary | ICD-10-CM | POA: Diagnosis not present

## 2017-02-24 DIAGNOSIS — M549 Dorsalgia, unspecified: Secondary | ICD-10-CM | POA: Diagnosis not present

## 2017-02-24 DIAGNOSIS — Z5111 Encounter for antineoplastic chemotherapy: Secondary | ICD-10-CM | POA: Diagnosis not present

## 2017-02-24 DIAGNOSIS — G4700 Insomnia, unspecified: Secondary | ICD-10-CM | POA: Diagnosis not present

## 2017-02-24 DIAGNOSIS — G893 Neoplasm related pain (acute) (chronic): Secondary | ICD-10-CM | POA: Diagnosis not present

## 2017-03-06 DIAGNOSIS — D649 Anemia, unspecified: Secondary | ICD-10-CM | POA: Diagnosis not present

## 2017-03-06 DIAGNOSIS — Z79899 Other long term (current) drug therapy: Secondary | ICD-10-CM | POA: Diagnosis not present

## 2017-03-06 DIAGNOSIS — Z79891 Long term (current) use of opiate analgesic: Secondary | ICD-10-CM | POA: Diagnosis not present

## 2017-03-06 DIAGNOSIS — N3091 Cystitis, unspecified with hematuria: Secondary | ICD-10-CM | POA: Diagnosis not present

## 2017-03-06 DIAGNOSIS — Z882 Allergy status to sulfonamides status: Secondary | ICD-10-CM | POA: Diagnosis not present

## 2017-03-08 DIAGNOSIS — R300 Dysuria: Secondary | ICD-10-CM | POA: Diagnosis not present

## 2017-03-08 DIAGNOSIS — N390 Urinary tract infection, site not specified: Secondary | ICD-10-CM | POA: Diagnosis not present

## 2017-03-10 DIAGNOSIS — Z5111 Encounter for antineoplastic chemotherapy: Secondary | ICD-10-CM | POA: Diagnosis not present

## 2017-03-10 DIAGNOSIS — C250 Malignant neoplasm of head of pancreas: Secondary | ICD-10-CM | POA: Diagnosis not present

## 2017-03-17 DIAGNOSIS — M5410 Radiculopathy, site unspecified: Secondary | ICD-10-CM | POA: Diagnosis not present

## 2017-03-17 DIAGNOSIS — M4726 Other spondylosis with radiculopathy, lumbar region: Secondary | ICD-10-CM | POA: Diagnosis not present

## 2017-03-24 DIAGNOSIS — M545 Low back pain: Secondary | ICD-10-CM | POA: Diagnosis not present

## 2017-03-24 DIAGNOSIS — L270 Generalized skin eruption due to drugs and medicaments taken internally: Secondary | ICD-10-CM | POA: Diagnosis not present

## 2017-03-24 DIAGNOSIS — Z5111 Encounter for antineoplastic chemotherapy: Secondary | ICD-10-CM | POA: Diagnosis not present

## 2017-03-24 DIAGNOSIS — G893 Neoplasm related pain (acute) (chronic): Secondary | ICD-10-CM | POA: Diagnosis not present

## 2017-03-24 DIAGNOSIS — E860 Dehydration: Secondary | ICD-10-CM | POA: Diagnosis not present

## 2017-03-24 DIAGNOSIS — R197 Diarrhea, unspecified: Secondary | ICD-10-CM | POA: Diagnosis not present

## 2017-03-24 DIAGNOSIS — G4700 Insomnia, unspecified: Secondary | ICD-10-CM | POA: Diagnosis not present

## 2017-03-24 DIAGNOSIS — C250 Malignant neoplasm of head of pancreas: Secondary | ICD-10-CM | POA: Diagnosis not present

## 2017-03-24 DIAGNOSIS — G629 Polyneuropathy, unspecified: Secondary | ICD-10-CM | POA: Diagnosis not present

## 2017-03-31 DIAGNOSIS — C250 Malignant neoplasm of head of pancreas: Secondary | ICD-10-CM | POA: Diagnosis not present

## 2017-03-31 DIAGNOSIS — Z5111 Encounter for antineoplastic chemotherapy: Secondary | ICD-10-CM | POA: Diagnosis not present

## 2017-04-14 DIAGNOSIS — G8929 Other chronic pain: Secondary | ICD-10-CM | POA: Diagnosis not present

## 2017-04-14 DIAGNOSIS — Z5111 Encounter for antineoplastic chemotherapy: Secondary | ICD-10-CM | POA: Diagnosis not present

## 2017-04-14 DIAGNOSIS — R918 Other nonspecific abnormal finding of lung field: Secondary | ICD-10-CM | POA: Diagnosis not present

## 2017-04-14 DIAGNOSIS — R110 Nausea: Secondary | ICD-10-CM | POA: Diagnosis not present

## 2017-04-14 DIAGNOSIS — G893 Neoplasm related pain (acute) (chronic): Secondary | ICD-10-CM | POA: Diagnosis not present

## 2017-04-14 DIAGNOSIS — L270 Generalized skin eruption due to drugs and medicaments taken internally: Secondary | ICD-10-CM | POA: Diagnosis not present

## 2017-04-14 DIAGNOSIS — C250 Malignant neoplasm of head of pancreas: Secondary | ICD-10-CM | POA: Diagnosis not present

## 2017-04-14 DIAGNOSIS — G4700 Insomnia, unspecified: Secondary | ICD-10-CM | POA: Diagnosis not present

## 2017-04-14 DIAGNOSIS — M549 Dorsalgia, unspecified: Secondary | ICD-10-CM | POA: Diagnosis not present

## 2017-04-14 DIAGNOSIS — T451X5A Adverse effect of antineoplastic and immunosuppressive drugs, initial encounter: Secondary | ICD-10-CM | POA: Diagnosis not present

## 2017-04-14 DIAGNOSIS — Z86711 Personal history of pulmonary embolism: Secondary | ICD-10-CM | POA: Diagnosis not present

## 2017-04-14 DIAGNOSIS — R197 Diarrhea, unspecified: Secondary | ICD-10-CM | POA: Diagnosis not present

## 2017-04-19 DIAGNOSIS — M545 Low back pain: Secondary | ICD-10-CM | POA: Diagnosis not present

## 2017-04-19 DIAGNOSIS — M47896 Other spondylosis, lumbar region: Secondary | ICD-10-CM | POA: Diagnosis not present

## 2017-04-19 DIAGNOSIS — Z23 Encounter for immunization: Secondary | ICD-10-CM | POA: Diagnosis not present

## 2017-04-19 DIAGNOSIS — G2581 Restless legs syndrome: Secondary | ICD-10-CM | POA: Diagnosis not present

## 2017-04-19 DIAGNOSIS — M810 Age-related osteoporosis without current pathological fracture: Secondary | ICD-10-CM | POA: Diagnosis not present

## 2017-04-19 DIAGNOSIS — G8929 Other chronic pain: Secondary | ICD-10-CM | POA: Diagnosis not present

## 2017-04-19 DIAGNOSIS — R69 Illness, unspecified: Secondary | ICD-10-CM | POA: Diagnosis not present

## 2017-04-19 DIAGNOSIS — M47816 Spondylosis without myelopathy or radiculopathy, lumbar region: Secondary | ICD-10-CM | POA: Diagnosis not present

## 2017-04-19 DIAGNOSIS — M438X5 Other specified deforming dorsopathies, thoracolumbar region: Secondary | ICD-10-CM | POA: Diagnosis not present

## 2017-04-19 DIAGNOSIS — M549 Dorsalgia, unspecified: Secondary | ICD-10-CM | POA: Diagnosis not present

## 2017-04-21 DIAGNOSIS — B351 Tinea unguium: Secondary | ICD-10-CM | POA: Diagnosis not present

## 2017-04-21 DIAGNOSIS — R197 Diarrhea, unspecified: Secondary | ICD-10-CM | POA: Diagnosis not present

## 2017-04-21 DIAGNOSIS — T451X5A Adverse effect of antineoplastic and immunosuppressive drugs, initial encounter: Secondary | ICD-10-CM | POA: Diagnosis not present

## 2017-04-21 DIAGNOSIS — L270 Generalized skin eruption due to drugs and medicaments taken internally: Secondary | ICD-10-CM | POA: Diagnosis not present

## 2017-04-21 DIAGNOSIS — C250 Malignant neoplasm of head of pancreas: Secondary | ICD-10-CM | POA: Diagnosis not present

## 2017-04-21 DIAGNOSIS — G893 Neoplasm related pain (acute) (chronic): Secondary | ICD-10-CM | POA: Diagnosis not present

## 2017-04-21 DIAGNOSIS — G4700 Insomnia, unspecified: Secondary | ICD-10-CM | POA: Diagnosis not present

## 2017-04-21 DIAGNOSIS — Z86711 Personal history of pulmonary embolism: Secondary | ICD-10-CM | POA: Diagnosis not present

## 2017-04-21 DIAGNOSIS — Z5111 Encounter for antineoplastic chemotherapy: Secondary | ICD-10-CM | POA: Diagnosis not present

## 2017-04-21 DIAGNOSIS — M79675 Pain in left toe(s): Secondary | ICD-10-CM | POA: Diagnosis not present

## 2017-04-21 DIAGNOSIS — M79674 Pain in right toe(s): Secondary | ICD-10-CM | POA: Diagnosis not present

## 2017-04-21 DIAGNOSIS — R110 Nausea: Secondary | ICD-10-CM | POA: Diagnosis not present

## 2017-04-21 DIAGNOSIS — M549 Dorsalgia, unspecified: Secondary | ICD-10-CM | POA: Diagnosis not present

## 2017-04-23 DIAGNOSIS — M810 Age-related osteoporosis without current pathological fracture: Secondary | ICD-10-CM | POA: Diagnosis not present

## 2017-04-23 DIAGNOSIS — M85852 Other specified disorders of bone density and structure, left thigh: Secondary | ICD-10-CM | POA: Diagnosis not present

## 2017-04-26 DIAGNOSIS — Z5181 Encounter for therapeutic drug level monitoring: Secondary | ICD-10-CM | POA: Diagnosis not present

## 2017-04-26 DIAGNOSIS — Z79899 Other long term (current) drug therapy: Secondary | ICD-10-CM | POA: Diagnosis not present

## 2017-04-26 DIAGNOSIS — M5136 Other intervertebral disc degeneration, lumbar region: Secondary | ICD-10-CM | POA: Diagnosis not present

## 2017-04-26 DIAGNOSIS — E559 Vitamin D deficiency, unspecified: Secondary | ICD-10-CM | POA: Diagnosis not present

## 2017-04-26 DIAGNOSIS — C250 Malignant neoplasm of head of pancreas: Secondary | ICD-10-CM | POA: Diagnosis not present

## 2017-04-26 DIAGNOSIS — Z8781 Personal history of (healed) traumatic fracture: Secondary | ICD-10-CM | POA: Diagnosis not present

## 2017-04-26 DIAGNOSIS — M810 Age-related osteoporosis without current pathological fracture: Secondary | ICD-10-CM | POA: Diagnosis not present

## 2017-04-29 DIAGNOSIS — R918 Other nonspecific abnormal finding of lung field: Secondary | ICD-10-CM | POA: Diagnosis not present

## 2017-04-29 DIAGNOSIS — C250 Malignant neoplasm of head of pancreas: Secondary | ICD-10-CM | POA: Diagnosis not present

## 2017-04-29 DIAGNOSIS — J439 Emphysema, unspecified: Secondary | ICD-10-CM | POA: Diagnosis not present

## 2017-04-29 DIAGNOSIS — K8689 Other specified diseases of pancreas: Secondary | ICD-10-CM | POA: Diagnosis not present

## 2017-04-29 DIAGNOSIS — N281 Cyst of kidney, acquired: Secondary | ICD-10-CM | POA: Diagnosis not present

## 2017-04-29 DIAGNOSIS — K828 Other specified diseases of gallbladder: Secondary | ICD-10-CM | POA: Diagnosis not present

## 2017-04-29 DIAGNOSIS — C259 Malignant neoplasm of pancreas, unspecified: Secondary | ICD-10-CM | POA: Diagnosis not present

## 2017-04-29 DIAGNOSIS — J9811 Atelectasis: Secondary | ICD-10-CM | POA: Diagnosis not present

## 2017-05-05 DIAGNOSIS — G8929 Other chronic pain: Secondary | ICD-10-CM | POA: Diagnosis not present

## 2017-05-05 DIAGNOSIS — M549 Dorsalgia, unspecified: Secondary | ICD-10-CM | POA: Diagnosis not present

## 2017-05-05 DIAGNOSIS — G4700 Insomnia, unspecified: Secondary | ICD-10-CM | POA: Diagnosis not present

## 2017-05-05 DIAGNOSIS — G893 Neoplasm related pain (acute) (chronic): Secondary | ICD-10-CM | POA: Diagnosis not present

## 2017-05-05 DIAGNOSIS — L270 Generalized skin eruption due to drugs and medicaments taken internally: Secondary | ICD-10-CM | POA: Diagnosis not present

## 2017-05-05 DIAGNOSIS — Z86711 Personal history of pulmonary embolism: Secondary | ICD-10-CM | POA: Diagnosis not present

## 2017-05-05 DIAGNOSIS — R630 Anorexia: Secondary | ICD-10-CM | POA: Diagnosis not present

## 2017-05-05 DIAGNOSIS — Z5111 Encounter for antineoplastic chemotherapy: Secondary | ICD-10-CM | POA: Diagnosis not present

## 2017-05-05 DIAGNOSIS — R634 Abnormal weight loss: Secondary | ICD-10-CM | POA: Diagnosis not present

## 2017-05-05 DIAGNOSIS — T451X5A Adverse effect of antineoplastic and immunosuppressive drugs, initial encounter: Secondary | ICD-10-CM | POA: Diagnosis not present

## 2017-05-05 DIAGNOSIS — R000 Tachycardia, unspecified: Secondary | ICD-10-CM | POA: Diagnosis not present

## 2017-05-05 DIAGNOSIS — R110 Nausea: Secondary | ICD-10-CM | POA: Diagnosis not present

## 2017-05-05 DIAGNOSIS — C250 Malignant neoplasm of head of pancreas: Secondary | ICD-10-CM | POA: Diagnosis not present

## 2017-05-24 DIAGNOSIS — Z8639 Personal history of other endocrine, nutritional and metabolic disease: Secondary | ICD-10-CM | POA: Diagnosis not present

## 2017-05-24 DIAGNOSIS — Z8781 Personal history of (healed) traumatic fracture: Secondary | ICD-10-CM | POA: Diagnosis not present

## 2017-05-24 DIAGNOSIS — Z1321 Encounter for screening for nutritional disorder: Secondary | ICD-10-CM | POA: Diagnosis not present

## 2017-05-24 DIAGNOSIS — Z8262 Family history of osteoporosis: Secondary | ICD-10-CM | POA: Diagnosis not present

## 2017-05-24 DIAGNOSIS — R7989 Other specified abnormal findings of blood chemistry: Secondary | ICD-10-CM | POA: Diagnosis not present

## 2017-05-24 DIAGNOSIS — Z923 Personal history of irradiation: Secondary | ICD-10-CM | POA: Diagnosis not present

## 2017-05-24 DIAGNOSIS — Z8507 Personal history of malignant neoplasm of pancreas: Secondary | ICD-10-CM | POA: Diagnosis not present

## 2017-05-24 DIAGNOSIS — M818 Other osteoporosis without current pathological fracture: Secondary | ICD-10-CM | POA: Diagnosis not present

## 2017-05-24 DIAGNOSIS — Z79899 Other long term (current) drug therapy: Secondary | ICD-10-CM | POA: Diagnosis not present

## 2017-05-24 DIAGNOSIS — R29890 Loss of height: Secondary | ICD-10-CM | POA: Diagnosis not present

## 2017-05-24 DIAGNOSIS — Z86711 Personal history of pulmonary embolism: Secondary | ICD-10-CM | POA: Diagnosis not present

## 2017-05-26 DIAGNOSIS — C250 Malignant neoplasm of head of pancreas: Secondary | ICD-10-CM | POA: Diagnosis not present

## 2017-05-26 DIAGNOSIS — Z5111 Encounter for antineoplastic chemotherapy: Secondary | ICD-10-CM | POA: Diagnosis not present

## 2017-05-26 DIAGNOSIS — G893 Neoplasm related pain (acute) (chronic): Secondary | ICD-10-CM | POA: Diagnosis not present

## 2017-05-26 DIAGNOSIS — T451X5A Adverse effect of antineoplastic and immunosuppressive drugs, initial encounter: Secondary | ICD-10-CM | POA: Diagnosis not present

## 2017-05-26 DIAGNOSIS — R630 Anorexia: Secondary | ICD-10-CM | POA: Diagnosis not present

## 2017-05-26 DIAGNOSIS — L270 Generalized skin eruption due to drugs and medicaments taken internally: Secondary | ICD-10-CM | POA: Diagnosis not present

## 2017-05-26 DIAGNOSIS — Z86711 Personal history of pulmonary embolism: Secondary | ICD-10-CM | POA: Diagnosis not present

## 2017-05-26 DIAGNOSIS — G8929 Other chronic pain: Secondary | ICD-10-CM | POA: Diagnosis not present

## 2017-05-26 DIAGNOSIS — R110 Nausea: Secondary | ICD-10-CM | POA: Diagnosis not present

## 2017-05-26 DIAGNOSIS — R634 Abnormal weight loss: Secondary | ICD-10-CM | POA: Diagnosis not present

## 2017-05-26 DIAGNOSIS — G4700 Insomnia, unspecified: Secondary | ICD-10-CM | POA: Diagnosis not present

## 2017-05-26 DIAGNOSIS — R000 Tachycardia, unspecified: Secondary | ICD-10-CM | POA: Diagnosis not present

## 2017-05-26 DIAGNOSIS — M549 Dorsalgia, unspecified: Secondary | ICD-10-CM | POA: Diagnosis not present

## 2017-06-02 DIAGNOSIS — G893 Neoplasm related pain (acute) (chronic): Secondary | ICD-10-CM | POA: Diagnosis not present

## 2017-06-02 DIAGNOSIS — C250 Malignant neoplasm of head of pancreas: Secondary | ICD-10-CM | POA: Diagnosis not present

## 2017-06-02 DIAGNOSIS — T451X5A Adverse effect of antineoplastic and immunosuppressive drugs, initial encounter: Secondary | ICD-10-CM | POA: Diagnosis not present

## 2017-06-02 DIAGNOSIS — R110 Nausea: Secondary | ICD-10-CM | POA: Diagnosis not present

## 2017-06-02 DIAGNOSIS — Z5111 Encounter for antineoplastic chemotherapy: Secondary | ICD-10-CM | POA: Diagnosis not present

## 2017-06-02 DIAGNOSIS — M549 Dorsalgia, unspecified: Secondary | ICD-10-CM | POA: Diagnosis not present

## 2017-06-02 DIAGNOSIS — Z86711 Personal history of pulmonary embolism: Secondary | ICD-10-CM | POA: Diagnosis not present

## 2017-06-02 DIAGNOSIS — R000 Tachycardia, unspecified: Secondary | ICD-10-CM | POA: Diagnosis not present

## 2017-06-16 DIAGNOSIS — M549 Dorsalgia, unspecified: Secondary | ICD-10-CM | POA: Diagnosis not present

## 2017-06-16 DIAGNOSIS — Z5111 Encounter for antineoplastic chemotherapy: Secondary | ICD-10-CM | POA: Diagnosis not present

## 2017-06-16 DIAGNOSIS — R110 Nausea: Secondary | ICD-10-CM | POA: Diagnosis not present

## 2017-06-16 DIAGNOSIS — C250 Malignant neoplasm of head of pancreas: Secondary | ICD-10-CM | POA: Diagnosis not present

## 2017-06-16 DIAGNOSIS — R69 Illness, unspecified: Secondary | ICD-10-CM | POA: Diagnosis not present

## 2017-06-16 DIAGNOSIS — G893 Neoplasm related pain (acute) (chronic): Secondary | ICD-10-CM | POA: Diagnosis not present

## 2017-06-16 DIAGNOSIS — G4700 Insomnia, unspecified: Secondary | ICD-10-CM | POA: Diagnosis not present

## 2017-06-16 DIAGNOSIS — R197 Diarrhea, unspecified: Secondary | ICD-10-CM | POA: Diagnosis not present

## 2017-06-16 DIAGNOSIS — T451X5D Adverse effect of antineoplastic and immunosuppressive drugs, subsequent encounter: Secondary | ICD-10-CM | POA: Diagnosis not present

## 2017-06-16 DIAGNOSIS — G8929 Other chronic pain: Secondary | ICD-10-CM | POA: Diagnosis not present

## 2017-06-30 DIAGNOSIS — R69 Illness, unspecified: Secondary | ICD-10-CM | POA: Diagnosis not present

## 2017-06-30 DIAGNOSIS — R197 Diarrhea, unspecified: Secondary | ICD-10-CM | POA: Diagnosis not present

## 2017-06-30 DIAGNOSIS — Z5111 Encounter for antineoplastic chemotherapy: Secondary | ICD-10-CM | POA: Diagnosis not present

## 2017-06-30 DIAGNOSIS — G8929 Other chronic pain: Secondary | ICD-10-CM | POA: Diagnosis not present

## 2017-06-30 DIAGNOSIS — M549 Dorsalgia, unspecified: Secondary | ICD-10-CM | POA: Diagnosis not present

## 2017-06-30 DIAGNOSIS — C250 Malignant neoplasm of head of pancreas: Secondary | ICD-10-CM | POA: Diagnosis not present

## 2017-06-30 DIAGNOSIS — G4700 Insomnia, unspecified: Secondary | ICD-10-CM | POA: Diagnosis not present

## 2017-06-30 DIAGNOSIS — G893 Neoplasm related pain (acute) (chronic): Secondary | ICD-10-CM | POA: Diagnosis not present

## 2017-06-30 DIAGNOSIS — R110 Nausea: Secondary | ICD-10-CM | POA: Diagnosis not present

## 2017-06-30 DIAGNOSIS — T451X5D Adverse effect of antineoplastic and immunosuppressive drugs, subsequent encounter: Secondary | ICD-10-CM | POA: Diagnosis not present

## 2020-09-03 DEATH — deceased
